# Patient Record
Sex: Female | Born: 1990 | Race: White | Hispanic: No | Marital: Single | Smoking: Never smoker
Health system: Southern US, Community
[De-identification: ages and names within clinical notes are randomized; demographics above are authoritative.]

---
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
## 2013-07-08 ENCOUNTER — Encounter: Payer: Self-pay | Admitting: Certified Nurse Midwife

## 2014-05-26 ENCOUNTER — Encounter: Payer: Self-pay | Admitting: Certified Nurse Midwife

## 2014-07-28 ENCOUNTER — Encounter: Payer: Self-pay | Admitting: Certified Nurse Midwife

## 2014-12-24 ENCOUNTER — Ambulatory Visit: Payer: Self-pay | Admitting: Certified Nurse Midwife

## 2014-12-25 ENCOUNTER — Encounter: Payer: Self-pay | Admitting: Certified Nurse Midwife

## 2014-12-25 ENCOUNTER — Ambulatory Visit (INDEPENDENT_AMBULATORY_CARE_PROVIDER_SITE_OTHER): Payer: 59 | Admitting: Certified Nurse Midwife

## 2014-12-25 VITALS — BP 120/72 | HR 70 | Resp 20 | Ht 66.25 in | Wt 132.0 lb

## 2014-12-25 DIAGNOSIS — Z30011 Encounter for initial prescription of contraceptive pills: Secondary | ICD-10-CM

## 2014-12-25 DIAGNOSIS — Z01419 Encounter for gynecological examination (general) (routine) without abnormal findings: Secondary | ICD-10-CM

## 2014-12-25 DIAGNOSIS — Z0000 Encounter for general adult medical examination without abnormal findings: Secondary | ICD-10-CM | POA: Diagnosis not present

## 2014-12-25 DIAGNOSIS — Z124 Encounter for screening for malignant neoplasm of cervix: Secondary | ICD-10-CM

## 2014-12-25 LAB — POCT URINALYSIS DIPSTICK
Bilirubin, UA: NEGATIVE
Blood, UA: NEGATIVE
GLUCOSE UA: NEGATIVE
Ketones, UA: NEGATIVE
LEUKOCYTES UA: NEGATIVE
Nitrite, UA: NEGATIVE
PROTEIN UA: NEGATIVE
UROBILINOGEN UA: NEGATIVE
pH, UA: 5

## 2014-12-25 LAB — HEMOGLOBIN, FINGERSTICK: HEMOGLOBIN, FINGERSTICK: 15 g/dL (ref 12.0–16.0)

## 2014-12-25 MED ORDER — DESOGESTREL-ETHINYL ESTRADIOL 0.15-30 MG-MCG PO TABS: 1.0000 | ORAL_TABLET | Freq: Every day | ORAL | Status: DC

## 2014-12-25 NOTE — Patient Instructions (Addendum)
General topics  Next pap or exam is  due in 1 year Take a Women's multivitamin Take 1200 mg. of calcium daily - prefer dietary If any concerns in interim to call back  Breast Self-Awareness Practicing breast self-awareness may pick up problems early, prevent significant medical complications, and possibly save your life. By practicing breast self-awareness, you can become familiar with how your breasts look and feel and if your breasts are changing. This allows you to notice changes early. It can also offer you some reassurance that your breast health is good. One way to learn what is normal for your breasts and whether your breasts are changing is to do a breast self-exam. If you find a lump or something that was not present in the past, it is best to contact your caregiver right away. Other findings that should be evaluated by your caregiver include nipple discharge, especially if it is bloody; skin changes or reddening; areas where the skin seems to be pulled in (retracted); or new lumps and bumps. Breast pain is seldom associated with cancer (malignancy), but should also be evaluated by a caregiver. BREAST SELF-EXAM The best time to examine your breasts is 5 7 days after your menstrual period is over.  ExitCare Patient Information 2013 [employer].   Exercise to Stay Healthy Exercise helps you become and stay healthy. EXERCISE IDEAS AND TIPS Choose exercises that:  You enjoy.  Fit into your day. You do not need to exercise really hard to be healthy. You can do exercises at a slow or medium level and stay healthy. You can:  Stretch before and after working out.  Try yoga, Pilates, or tai chi.  Lift weights.  Walk fast, swim, jog, run, climb stairs, bicycle, dance, or rollerskate.  Take aerobic classes. Exercises that burn about 150 calories:  Running 1  miles in 15 minutes.  Playing volleyball for 45 to 60 minutes.  Washing and waxing a car for 45 to 60  minutes.  Playing touch football for 45 minutes.  Walking 1  miles in 35 minutes.  Pushing a stroller 1  miles in 30 minutes.  Playing basketball for 30 minutes.  Raking leaves for 30 minutes.  Bicycling 5 miles in 30 minutes.  Walking 2 miles in 30 minutes.  Dancing for 30 minutes.  Shoveling snow for 15 minutes.  Swimming laps for 20 minutes.  Walking up stairs for 15 minutes.  Bicycling 4 miles in 15 minutes.  Gardening for 30 to 45 minutes.  Jumping rope for 15 minutes.  Washing windows or floors for 45 to 60 minutes. Document Released: 04/30/2010 Document Revised: 06/20/2011 Document Reviewed: 04/30/2010 ExitCare Patient Information 2013 [employer].   Other topics ( that may be useful information):    Sexually Transmitted Disease Sexually transmitted disease (STD) refers to any infection that is passed from person to person during sexual activity. This may happen by way of saliva, semen, blood, vaginal mucus, or urine. Common STDs include:  Gonorrhea.  Chlamydia.  Syphilis.  HIV/AIDS.  Genital herpes.  Hepatitis B and C.  Trichomonas.  Human papillomavirus (HPV).  Pubic lice. CAUSES  An STD may be spread by bacteria, virus, or parasite. A person can get an STD by:  Sexual intercourse with an infected person.  Sharing sex toys with an infected person.  Sharing needles with an infected person.  Having intimate contact with the genitals, mouth, or rectal areas of an infected person. SYMPTOMS  Some people may not have any symptoms, but   they can still pass the infection to others. Different STDs have different symptoms. Symptoms include:  Painful or bloody urination.  Pain in the pelvis, abdomen, vagina, anus, throat, or eyes.  Skin rash, itching, irritation, growths, or sores (lesions). These usually occur in the genital or anal area.  Abnormal vaginal discharge.  Penile discharge in men.  Soft, flesh-colored skin growths in the  genital or anal area.  Fever.  Pain or bleeding during sexual intercourse.  Swollen glands in the groin area.  Yellow skin and eyes (jaundice). This is seen with hepatitis. DIAGNOSIS  To make a diagnosis, your caregiver may:  Take a medical history.  Perform a physical exam.  Take a specimen (culture) to be examined.  Examine a sample of discharge under a microscope.  Perform blood test TREATMENT   Chlamydia, gonorrhea, trichomonas, and syphilis can be cured with antibiotic medicine.  Genital herpes, hepatitis, and HIV can be treated, but not cured, with prescribed medicines. The medicines will lessen the symptoms.  Genital warts from HPV can be treated with medicine or by freezing, burning (electrocautery), or surgery. Warts may come back.  HPV is a virus and cannot be cured with medicine or surgery.However, abnormal areas may be followed very closely by your caregiver and may be removed from the cervix, vagina, or vulva through office procedures or surgery. If your diagnosis is confirmed, your recent sexual partners need treatment. This is true even if they are symptom-free or have a negative culture or evaluation. They should not have sex until their caregiver says it is okay. HOME CARE INSTRUCTIONS  All sexual partners should be informed, tested, and treated for all STDs.  Take your antibiotics as directed. Finish them even if you start to feel better.  Only take over-the-counter or prescription medicines for pain, discomfort, or fever as directed by your caregiver.  Rest.  Eat a balanced diet and drink enough fluids to keep your urine clear or pale yellow.  Do not have sex until treatment is completed and you have followed up with your caregiver. STDs should be checked after treatment.  Keep all follow-up appointments, Pap tests, and blood tests as directed by your caregiver.  Only use latex condoms and water-soluble lubricants during sexual activity. Do not use  petroleum jelly or oils.  Avoid alcohol and illegal drugs.  Get vaccinated for HPV and hepatitis. If you have not received these vaccines in the past, talk to your caregiver about whether one or both might be right for you.  Avoid risky sex practices that can break the skin. The only way to avoid getting an STD is to avoid all sexual activity.Latex condoms and dental dams (for oral sex) will help lessen the risk of getting an STD, but will not completely eliminate the risk. SEEK MEDICAL CARE IF:   You have a fever.  You have any new or worsening symptoms. Document Released: 06/18/2002 Document Revised: 06/20/2011 Document Reviewed: 06/25/2010 [facility] Patient Information 2013 [place].    Domestic Abuse You are being battered or abused if someone close to you hits, pushes, or physically hurts you in any way. You also are being abused if you are forced into activities. You are being sexually abused if you are forced to have sexual contact of any kind. You are being emotionally abused if you are made to feel worthless or if you are constantly threatened. It is important to remember that help is available. No one has the right to abuse you. PREVENTION OF FURTHER  ABUSE  Learn the warning signs of danger. This varies with situations but may include: the use of alcohol, threats, isolation from friends and family, or forced sexual contact. Leave if you feel that violence is going to occur.  If you are attacked or beaten, report it to the police so the abuse is documented. You do not have to press charges. The police can protect you while you or the attackers are leaving. Get the officer's name and badge number and a copy of the report.  Find someone you can trust and tell them what is happening to you: your caregiver, a nurse, clergy member, close friend or family member. Feeling ashamed is natural, but remember that you have done nothing wrong. No one deserves abuse. Document Released:  03/25/2000 Document Revised: 06/20/2011 Document Reviewed: 06/03/2010 ExitCare Patient Information 2013 [employer].    How Much is Too Much Alcohol? Drinking too much alcohol can cause injury, accidents, and health problems. These types of problems can include:   Car crashes.  Falls.  Family fighting (domestic violence).  Drowning.  Fights.  Injuries.  Burns.  Damage to certain organs.  Having a baby with birth defects. ONE DRINK CAN BE TOO MUCH WHEN YOU ARE:  Working.  Pregnant or breastfeeding.  Taking medicines. Ask your doctor.  Driving or planning to drive. If you or someone you know has a drinking problem, get help from a doctor.  Document Released: 01/22/2009 Document Revised: 06/20/2011 Document Reviewed: 01/22/2009 ExitCare Patient Information 2013 [employer].   Smoking Hazards Smoking cigarettes is extremely bad for your health. Tobacco smoke has over 200 known poisons in it. There are over 60 chemicals in tobacco smoke that cause cancer. Some of the chemicals found in cigarette smoke include:   Cyanide.  Benzene.  Formaldehyde.  Methanol (wood alcohol).  Acetylene (fuel used in welding torches).  Ammonia. Cigarette smoke also contains the poisonous gases nitrogen oxide and carbon monoxide.  Cigarette smokers have an increased risk of many serious medical problems and Smoking causes approximately:  90% of all lung cancer deaths in men.  80% of all lung cancer deaths in women.  90% of deaths from chronic obstructive lung disease. Compared with nonsmokers, smoking increases the risk of:  Coronary heart disease by 2 to 4 times.  Stroke by 2 to 4 times.  Men developing lung cancer by 23 times.  Women developing lung cancer by 13 times.  Dying from chronic obstructive lung diseases by 12 times.  . Smoking is the most preventable cause of death and disease in our society.  WHY IS SMOKING ADDICTIVE?  Nicotine is the chemical  agent in tobacco that is capable of causing addiction or dependence.  When you smoke and inhale, nicotine is absorbed rapidly into the bloodstream through your lungs. Nicotine absorbed through the lungs is capable of creating a powerful addiction. Both inhaled and non-inhaled nicotine may be addictive.  Addiction studies of cigarettes and spit tobacco show that addiction to nicotine occurs mainly during the teen years, when young people begin using tobacco products. WHAT ARE THE BENEFITS OF QUITTING?  There are many health benefits to quitting smoking.   Likelihood of developing cancer and heart disease decreases. Health improvements are seen almost immediately.  Blood pressure, pulse rate, and breathing patterns start returning to normal soon after quitting. QUITTING SMOKING   American Lung Association - 1-800-LUNGUSA  American Cancer Society - 1-800-ACS-2345 Document Released: 05/05/2004 Document Revised: 06/20/2011 Document Reviewed: 01/07/2009 ExitCare Patient Information 2013 ExitCare[employer]   LL[employer].   Stress Management Stress is a state of physical or mental tension that often results from changes in your life or normal routine. Some common causes of stress are:  Death of a loved one.  Injuries or severe illnesses.  Getting fired or changing jobs.  Moving into a new home. Other causes may be:  Sexual problems.  Business or financial losses.  Taking on a large debt.  Regular conflict with someone at home or at work.  Constant tiredness from lack of sleep. It is not just bad things that are stressful. It may be stressful to:  Win the lottery.  Get married.  Buy a new car. The amount of stress that can be easily tolerated varies from person to person. Changes generally cause stress, regardless of the types of change. Too much stress can affect your health. It may lead to physical or emotional problems. Too little stress (boredom) may also become stressful. SUGGESTIONS TO  REDUCE STRESS:  Talk things over with your family and friends. It often is helpful to share your concerns and worries. If you feel your problem is serious, you may want to get help from a professional counselor.  Consider your problems one at a time instead of lumping them all together. Trying to take care of everything at once may seem impossible. List all the things you need to do and then start with the most important one. Set a goal to accomplish 2 or 3 things each day. If you expect to do too many in a single day you will naturally fail, causing you to feel even more stressed.  Do not use alcohol or drugs to relieve stress. Although you may feel better for a short time, they do not remove the problems that caused the stress. They can also be habit forming.  Exercise regularly - at least 3 times per week. Physical exercise can help to relieve that "uptight" feeling and will relax you.  The shortest distance between despair and hope is often a good night's sleep.  Go to bed and get up on time allowing yourself time for appointments without being rushed.  Take a short "time-out" period from any stressful situation that occurs during the day. Close your eyes and take some deep breaths. Starting with the muscles in your face, tense them, hold it for a few seconds, then relax. Repeat this with the muscles in your neck, shoulders, hand, stomach, back and legs.  Take good care of yourself. Eat a balanced diet and get plenty of rest.  Schedule time for having fun. Take a break from your daily routine to relax. HOME CARE INSTRUCTIONS   Call if you feel overwhelmed by your problems and feel you can no longer manage them on your own.  Return immediately if you feel like hurting yourself or someone else. Document Released: 09/21/2000 Document Revised: 06/20/2011 Document Reviewed: 05/14/2007 [facility] Patient Information 2013 [place].  Oral Contraception Use Oral contraceptive pills (OCPs)  are medicines taken to prevent pregnancy. OCPs work by preventing the ovaries from releasing eggs. The hormones in OCPs also cause the cervical mucus to thicken, preventing the sperm from entering the uterus. The hormones also cause the uterine lining to become thin, not allowing a fertilized egg to attach to the inside of the uterus. OCPs are highly effective when taken exactly as prescribed. However, OCPs do not prevent sexually transmitted diseases (STDs). Safe sex practices, such as using condoms along with an OCP, can help prevent STDs. Before  taking OCPs, you may have a physical exam and Pap test. Your health care provider may also order blood tests if necessary. Your health care provider will make sure you are a good candidate for oral contraception. Discuss with your health care provider the possible side effects of the OCP you may be prescribed. When starting an OCP, it can take 2 to 3 months for the body to adjust to the changes in hormone levels in your body.  HOW TO TAKE ORAL CONTRACEPTIVE PILLS Your health care provider may advise you on how to start taking the first cycle of OCPs. Otherwise, you can:   Start on day 1 of your menstrual period. You will not need any backup contraceptive protection with this start time.   Start on the first Sunday after your menstrual period or the day you get your prescription. In these cases, you will need to use backup contraceptive protection for the first week.   Start the pill at any time of your cycle. If you take the pill within 5 days of the start of your period, you are protected against pregnancy right away. In this case, you will not need a backup form of birth control. If you start at any other time of your menstrual cycle, you will need to use another form of birth control for 7 days. If your OCP is the type called a minipill, it will protect you from pregnancy after taking it for 2 days (48 hours). After you have started taking OCPs:   If you  forget to take 1 pill, take it as soon as you remember. Take the next pill at the regular time.   If you miss 2 or more pills, call your health care provider because different pills have different instructions for missed doses. Use backup birth control until your next menstrual period starts.   If you use a 28-day pack that contains inactive pills and you miss 1 of the last 7 pills (pills with no hormones), it will not matter. Throw away the rest of the non-hormone pills and start a new pill pack.  No matter which day you start the OCP, you will always start a new pack on that same day of the week. Have an extra pack of OCPs and a backup contraceptive method available in case you miss some pills or lose your OCP pack.  HOME CARE INSTRUCTIONS   Do not smoke.   Always use a condom to protect against STDs. OCPs do not protect against STDs.   Use a calendar to mark your menstrual period days.   Read the information and directions that came with your OCP. Talk to your health care provider if you have questions.  SEEK MEDICAL CARE IF:   You develop nausea and vomiting.   You have abnormal vaginal discharge or bleeding.   You develop a rash.   You miss your menstrual period.   You are losing your hair.   You need treatment for mood swings or depression.   You get dizzy when taking the OCP.   You develop acne from taking the OCP.   You become pregnant.  SEEK IMMEDIATE MEDICAL CARE IF:   You develop chest pain.   You develop shortness of breath.   You have an uncontrolled or severe headache.   You develop numbness or slurred speech.   You develop visual problems.   You develop pain, redness, and swelling in the legs.  Document Released: 03/17/2011 Document Revised: 08/12/2013 Document Reviewed:  09/16/2012 ExitCare Patient Information 2015 [place], [place]. This information is not intended to replace advice given to you by your health care provider. Make  sure you discuss any questions you have with your health care provider.

## 2014-12-25 NOTE — Progress Notes (Signed)
24 y.o. G0P0000 Single  Caucasian Fe here to establish gyn care and  for annual exam. Periods monthly, but not same day, light amount with duration of 3 days. Contraception working well, but would like to back on OCP. Was on Reclipsen in past with no problems. STD Screening labs desired. No other health issues today. Mother with patient(patient here). Patient verbalized permission for mother to be present for all conversation and exam if mother desires.  Patient's last menstrual period was 12/01/2014.          Sexually active: Yes.    The current method of family planning is condoms all the time.    Exercising: Yes.    alternate cardio with muscle training Smoker:  no  Health Maintenance: Pap:  none MMG:  none Colonoscopy:  none BMD:   none TDaP:  2009 Labs: urine-neg, hgb-15.0 Self breast exam: done monthly   reports that she has never smoked. She does not have any smokeless tobacco history on file. She reports that she drinks about 3.6 oz of alcohol per week.  History reviewed. No pertinent past medical history.  History reviewed. No pertinent past surgical history.  No current outpatient prescriptions on file.   No current facility-administered medications for this visit.    Family History  Problem Relation Age of Onset  . Colon cancer Maternal Grandfather   . Heart disease Maternal Grandfather     ROS:  Pertinent items are noted in HPI.  Otherwise, a comprehensive ROS was negative.  Exam:   BP 120/72 mmHg  Pulse 70  Resp 20  Ht 5' 6.25" (1.683 m)  Wt 132 lb (59.875 kg)  BMI 21.14 kg/m2  LMP 12/01/2014 Height: 5' 6.25" (168.3 cm) Ht Readings from Last 3 Encounters:  12/25/14 5' 6.25" (1.683 m)    General appearance: alert, cooperative and appears stated age Head: Normocephalic, without obvious abnormality, atraumatic Neck: no adenopathy, supple, symmetrical, trachea midline and thyroid normal to inspection and palpation Lungs: clear to auscultation  bilaterally Breasts: normal appearance, no masses or tenderness, No nipple retraction or dimpling, No nipple discharge or bleeding, No axillary or supraclavicular adenopathy Heart: regular rate and rhythm Abdomen: soft, non-tender; no masses,  no organomegaly Extremities: extremities normal, atraumatic, no cyanosis or edema Skin: Skin color, texture, turgor normal. No rashes or lesions Lymph nodes: Cervical, supraclavicular, and axillary nodes normal. No abnormal inguinal nodes palpated Neurologic: Grossly normal   Pelvic: External genitalia:  no lesions              Urethra:  normal appearing urethra with no masses, tenderness or lesions              Bartholin's and Skene's: normal                 Vagina: normal appearing vagina with normal color and discharge, no lesions              Cervix: normal,non tender,no lesions              Pap taken: Yes.   Bimanual Exam:  Uterus:  normal size, contour, position, consistency, mobility, non-tender and anteflexed              Adnexa: normal adnexa and no mass, fullness, tenderness               Rectovaginal: Confirms               Anus:  normal appearance  Chaperone present: yes  A:  Well Woman with normal exam  Contraception OCP desired  STD screening desired  Gardasil Candidate to finish series  P:   Reviewed health and wellness pertinent to exam  Rx Reclipsen see order  Labs: GC,Chlamydia, HIV,RPR,HSV1,2, Hep C, Affirm  Discussed risks and benefits and declines to finish  Pap smear as above with HPV reflex.   counseled on breast self exam, STD prevention, HIV risk factors and prevention, use and side effects of OCP's, adequate intake of calcium and vitamin D, diet and exercise  return annually or prn  An After Visit Summary was printed and given to the patient.

## 2014-12-26 LAB — WET PREP BY MOLECULAR PROBE
Candida species: NEGATIVE
GARDNERELLA VAGINALIS: NEGATIVE
Trichomonas vaginosis: NEGATIVE

## 2014-12-26 LAB — HIV ANTIBODY (ROUTINE TESTING W REFLEX): HIV 1&2 Ab, 4th Generation: NONREACTIVE

## 2014-12-26 LAB — HSV(HERPES SIMPLEX VRS) I + II AB-IGG: HSV 1 Glycoprotein G Ab, IgG: <0.1 IV

## 2014-12-26 LAB — RPR

## 2014-12-26 LAB — IPS PAP TEST WITH REFLEX TO HPV

## 2014-12-26 NOTE — Progress Notes (Signed)
Reviewed personally.  M. Suzanne Kemiyah Tarazon, MD.  

## 2014-12-27 LAB — IPS N GONORRHOEA AND CHLAMYDIA BY PCR

## 2015-12-30 ENCOUNTER — Ambulatory Visit: Payer: 59 | Admitting: Certified Nurse Midwife

## 2017-12-22 ENCOUNTER — Ambulatory Visit: Payer: 59 | Admitting: Certified Nurse Midwife

## 2018-01-12 ENCOUNTER — Telehealth: Payer: Self-pay | Admitting: Certified Nurse Midwife

## 2018-01-12 ENCOUNTER — Ambulatory Visit: Payer: 59 | Admitting: Certified Nurse Midwife

## 2018-01-12 ENCOUNTER — Other Ambulatory Visit (HOSPITAL_COMMUNITY)
Admission: RE | Admit: 2018-01-12 | Discharge: 2018-01-12 | Disposition: A | Discharge status: Home / Self Care | Payer: 59 | Source: Ambulatory Visit | Attending: Obstetrics & Gynecology | Admitting: Obstetrics & Gynecology

## 2018-01-12 ENCOUNTER — Other Ambulatory Visit: Payer: Self-pay

## 2018-01-12 ENCOUNTER — Encounter: Payer: Self-pay | Admitting: Certified Nurse Midwife

## 2018-01-12 VITALS — BP 116/70 | HR 70 | Resp 16 | Ht 66.75 in | Wt 126.0 lb

## 2018-01-12 DIAGNOSIS — Z0000 Encounter for general adult medical examination without abnormal findings: Secondary | ICD-10-CM

## 2018-01-12 DIAGNOSIS — Z113 Encounter for screening for infections with a predominantly sexual mode of transmission: Secondary | ICD-10-CM | POA: Diagnosis not present

## 2018-01-12 DIAGNOSIS — N912 Amenorrhea, unspecified: Secondary | ICD-10-CM

## 2018-01-12 DIAGNOSIS — Z01419 Encounter for gynecological examination (general) (routine) without abnormal findings: Secondary | ICD-10-CM

## 2018-01-12 DIAGNOSIS — Z124 Encounter for screening for malignant neoplasm of cervix: Secondary | ICD-10-CM | POA: Diagnosis present

## 2018-01-12 LAB — POCT URINE PREGNANCY: PREG TEST UR: NEGATIVE

## 2018-01-12 NOTE — Patient Instructions (Signed)
General topics  Next pap or exam is  due in 1 year Take a Women's multivitamin Take 1200 mg. of calcium daily - prefer dietary If any concerns in interim to call back  Breast Self-Awareness Practicing breast self-awareness may pick up problems early, prevent significant medical complications, and possibly save your life. By practicing breast self-awareness, you can become familiar with how your breasts look and feel and if your breasts are changing. This allows you to notice changes early. It can also offer you some reassurance that your breast health is good. One way to learn what is normal for your breasts and whether your breasts are changing is to do a breast self-exam. If you find a lump or something that was not present in the past, it is best to contact your caregiver right away. Other findings that should be evaluated by your caregiver include nipple discharge, especially if it is bloody; skin changes or reddening; areas where the skin seems to be pulled in (retracted); or new lumps and bumps. Breast pain is seldom associated with cancer (malignancy), but should also be evaluated by a caregiver. BREAST SELF-EXAM The best time to examine your breasts is 5 7 days after your menstrual period is over.  ExitCare Patient Information 2013 ExitCare, LLC.   Exercise to Stay Healthy Exercise helps you become and stay healthy. EXERCISE IDEAS AND TIPS Choose exercises that:  You enjoy.  Fit into your day. You do not need to exercise really hard to be healthy. You can do exercises at a slow or medium level and stay healthy. You can:  Stretch before and after working out.  Try yoga, Pilates, or tai chi.  Lift weights.  Walk fast, swim, jog, run, climb stairs, bicycle, dance, or rollerskate.  Take aerobic classes. Exercises that burn about 150 calories:  Running 1  miles in 15 minutes.  Playing volleyball for 45 to 60 minutes.  Washing and waxing a car for 45 to 60  minutes.  Playing touch football for 45 minutes.  Walking 1  miles in 35 minutes.  Pushing a stroller 1  miles in 30 minutes.  Playing basketball for 30 minutes.  Raking leaves for 30 minutes.  Bicycling 5 miles in 30 minutes.  Walking 2 miles in 30 minutes.  Dancing for 30 minutes.  Shoveling snow for 15 minutes.  Swimming laps for 20 minutes.  Walking up stairs for 15 minutes.  Bicycling 4 miles in 15 minutes.  Gardening for 30 to 45 minutes.  Jumping rope for 15 minutes.  Washing windows or floors for 45 to 60 minutes. Document Released: 04/30/2010 Document Revised: 06/20/2011 Document Reviewed: 04/30/2010 ExitCare Patient Information 2013 ExitCare, LLC.   Other topics ( that may be useful information):    Sexually Transmitted Disease Sexually transmitted disease (STD) refers to any infection that is passed from person to person during sexual activity. This may happen by way of saliva, semen, blood, vaginal mucus, or urine. Common STDs include:  Gonorrhea.  Chlamydia.  Syphilis.  HIV/AIDS.  Genital herpes.  Hepatitis B and C.  Trichomonas.  Human papillomavirus (HPV).  Pubic lice. CAUSES  An STD may be spread by bacteria, virus, or parasite. A person can get an STD by:  Sexual intercourse with an infected person.  Sharing sex toys with an infected person.  Sharing needles with an infected person.  Having intimate contact with the genitals, mouth, or rectal areas of an infected person. SYMPTOMS  Some people may not have any symptoms, but   they can still pass the infection to others. Different STDs have different symptoms. Symptoms include:  Painful or bloody urination.  Pain in the pelvis, abdomen, vagina, anus, throat, or eyes.  Skin rash, itching, irritation, growths, or sores (lesions). These usually occur in the genital or anal area.  Abnormal vaginal discharge.  Penile discharge in men.  Soft, flesh-colored skin growths in the  genital or anal area.  Fever.  Pain or bleeding during sexual intercourse.  Swollen glands in the groin area.  Yellow skin and eyes (jaundice). This is seen with hepatitis. DIAGNOSIS  To make a diagnosis, your caregiver may:  Take a medical history.  Perform a physical exam.  Take a specimen (culture) to be examined.  Examine a sample of discharge under a microscope.  Perform blood test TREATMENT   Chlamydia, gonorrhea, trichomonas, and syphilis can be cured with antibiotic medicine.  Genital herpes, hepatitis, and HIV can be treated, but not cured, with prescribed medicines. The medicines will lessen the symptoms.  Genital warts from HPV can be treated with medicine or by freezing, burning (electrocautery), or surgery. Warts may come back.  HPV is a virus and cannot be cured with medicine or surgery.However, abnormal areas may be followed very closely by your caregiver and may be removed from the cervix, vagina, or vulva through office procedures or surgery. If your diagnosis is confirmed, your recent sexual partners need treatment. This is true even if they are symptom-free or have a negative culture or evaluation. They should not have sex until their caregiver says it is okay. HOME CARE INSTRUCTIONS  All sexual partners should be informed, tested, and treated for all STDs.  Take your antibiotics as directed. Finish them even if you start to feel better.  Only take over-the-counter or prescription medicines for pain, discomfort, or fever as directed by your caregiver.  Rest.  Eat a balanced diet and drink enough fluids to keep your urine clear or pale yellow.  Do not have sex until treatment is completed and you have followed up with your caregiver. STDs should be checked after treatment.  Keep all follow-up appointments, Pap tests, and blood tests as directed by your caregiver.  Only use latex condoms and water-soluble lubricants during sexual activity. Do not use  petroleum jelly or oils.  Avoid alcohol and illegal drugs.  Get vaccinated for HPV and hepatitis. If you have not received these vaccines in the past, talk to your caregiver about whether one or both might be right for you.  Avoid risky sex practices that can break the skin. The only way to avoid getting an STD is to avoid all sexual activity.Latex condoms and dental dams (for oral sex) will help lessen the risk of getting an STD, but will not completely eliminate the risk. SEEK MEDICAL CARE IF:   You have a fever.  You have any new or worsening symptoms. Document Released: 06/18/2002 Document Revised: 06/20/2011 Document Reviewed: 06/25/2010 [facility] Patient Information 2013 [employer].    Domestic Abuse You are being battered or abused if someone close to you hits, pushes, or physically hurts you in any way. You also are being abused if you are forced into activities. You are being sexually abused if you are forced to have sexual contact of any kind. You are being emotionally abused if you are made to feel worthless or if you are constantly threatened. It is important to remember that help is available. No one has the right to abuse you. PREVENTION OF FURTHER  ABUSE  Learn the warning signs of danger. This varies with situations but may include: the use of alcohol, threats, isolation from friends and family, or forced sexual contact. Leave if you feel that violence is going to occur.  If you are attacked or beaten, report it to the police so the abuse is documented. You do not have to press charges. The police can protect you while you or the attackers are leaving. Get the officer's name and badge number and a copy of the report.  Find someone you can trust and tell them what is happening to you: your caregiver, a nurse, clergy member, close friend or family member. Feeling ashamed is natural, but remember that you have done nothing wrong. No one deserves abuse. Document Released:  03/25/2000 Document Revised: 06/20/2011 Document Reviewed: 06/03/2010 ExitCare Patient Information 2013 ExitCare, LLC.    How Much is Too Much Alcohol? Drinking too much alcohol can cause injury, accidents, and health problems. These types of problems can include:   Car crashes.  Falls.  Family fighting (domestic violence).  Drowning.  Fights.  Injuries.  Burns.  Damage to certain organs.  Having a baby with birth defects. ONE DRINK CAN BE TOO MUCH WHEN YOU ARE:  Working.  Pregnant or breastfeeding.  Taking medicines. Ask your doctor.  Driving or planning to drive. If you or someone you know has a drinking problem, get help from a doctor.  Document Released: 01/22/2009 Document Revised: 06/20/2011 Document Reviewed: 01/22/2009 ExitCare Patient Information 2013 ExitCare, LLC.   Smoking Hazards Smoking cigarettes is extremely bad for your health. Tobacco smoke has over 200 known poisons in it. There are over 60 chemicals in tobacco smoke that cause cancer. Some of the chemicals found in cigarette smoke include:   Cyanide.  Benzene.  Formaldehyde.  Methanol (wood alcohol).  Acetylene (fuel used in welding torches).  Ammonia. Cigarette smoke also contains the poisonous gases nitrogen oxide and carbon monoxide.  Cigarette smokers have an increased risk of many serious medical problems and Smoking causes approximately:  90% of all lung cancer deaths in men.  80% of all lung cancer deaths in women.  90% of deaths from chronic obstructive lung disease. Compared with nonsmokers, smoking increases the risk of:  Coronary heart disease by 2 to 4 times.  Stroke by 2 to 4 times.  Men developing lung cancer by 23 times.  Women developing lung cancer by 13 times.  Dying from chronic obstructive lung diseases by 12 times.  . Smoking is the most preventable cause of death and disease in our society.  WHY IS SMOKING ADDICTIVE?  Nicotine is the chemical  agent in tobacco that is capable of causing addiction or dependence.  When you smoke and inhale, nicotine is absorbed rapidly into the bloodstream through your lungs. Nicotine absorbed through the lungs is capable of creating a powerful addiction. Both inhaled and non-inhaled nicotine may be addictive.  Addiction studies of cigarettes and spit tobacco show that addiction to nicotine occurs mainly during the teen years, when young people begin using tobacco products. WHAT ARE THE BENEFITS OF QUITTING?  There are many health benefits to quitting smoking.   Likelihood of developing cancer and heart disease decreases. Health improvements are seen almost immediately.  Blood pressure, pulse rate, and breathing patterns start returning to normal soon after quitting. QUITTING SMOKING   American Lung Association - 1-800-LUNGUSA  American Cancer Society - 1-800-ACS-2345 Document Released: 05/05/2004 Document Revised: 06/20/2011 Document Reviewed: 01/07/2009 ExitCare Patient Information 2013 ExitCare,   LLC.   Stress Management Stress is a state of physical or mental tension that often results from changes in your life or normal routine. Some common causes of stress are:  Death of a loved one.  Injuries or severe illnesses.  Getting fired or changing jobs.  Moving into a new home. Other causes may be:  Sexual problems.  Business or financial losses.  Taking on a large debt.  Regular conflict with someone at home or at work.  Constant tiredness from lack of sleep. It is not just bad things that are stressful. It may be stressful to:  Win the lottery.  Get married.  Buy a new car. The amount of stress that can be easily tolerated varies from person to person. Changes generally cause stress, regardless of the types of change. Too much stress can affect your health. It may lead to physical or emotional problems. Too little stress (boredom) may also become stressful. SUGGESTIONS TO  REDUCE STRESS:  Talk things over with your family and friends. It often is helpful to share your concerns and worries. If you feel your problem is serious, you may want to get help from a professional counselor.  Consider your problems one at a time instead of lumping them all together. Trying to take care of everything at once may seem impossible. List all the things you need to do and then start with the most important one. Set a goal to accomplish 2 or 3 things each day. If you expect to do too many in a single day you will naturally fail, causing you to feel even more stressed.  Do not use alcohol or drugs to relieve stress. Although you may feel better for a short time, they do not remove the problems that caused the stress. They can also be habit forming.  Exercise regularly - at least 3 times per week. Physical exercise can help to relieve that "uptight" feeling and will relax you.  The shortest distance between despair and hope is often a good night's sleep.  Go to bed and get up on time allowing yourself time for appointments without being rushed.  Take a short "time-out" period from any stressful situation that occurs during the day. Close your eyes and take some deep breaths. Starting with the muscles in your face, tense them, hold it for a few seconds, then relax. Repeat this with the muscles in your neck, shoulders, hand, stomach, back and legs.  Take good care of yourself. Eat a balanced diet and get plenty of rest.  Schedule time for having fun. Take a break from your daily routine to relax. HOME CARE INSTRUCTIONS   Call if you feel overwhelmed by your problems and feel you can no longer manage them on your own.  Return immediately if you feel like hurting yourself or someone else. Document Released: 09/21/2000 Document Revised: 06/20/2011 Document Reviewed: 05/14/2007 ExitCare Patient Information 2013 ExitCare, LLC.   

## 2018-01-12 NOTE — Telephone Encounter (Signed)
Patient sent the following message through MyChart. Routing to triage to assist patient with request.   Courtney Boyle!  It was so nice to see and visit with you today. You are simply, the best. I did have a question regarding where you said my uterus is located I forgot to ask (down towards my bottom). This won't be an issue for having children/pregnancy or anything will it? I assume you would have said something if this was the case but I just wanted to make sure....as I hope to eventually raise mini me's one day...even though that could be terrifying :) Thanks and look forward to being in touch soon! :)    -Courtney Boyle (happy, chipper girl)

## 2018-01-12 NOTE — Telephone Encounter (Signed)
Call to patient. Advised Courtney Boyle's office notes her uterus is anteflexed and no issues with pregnancy.   Routing to [employer]. Encounter closed.

## 2018-01-12 NOTE — Progress Notes (Signed)
27 y.o. G0P0000 Single  Caucasian Fe here to re-establish gyn care and for annual exam. Living back at home to be able to finish college at [employer].Periods have been normal and regular. LMP was end of August, but has been stressed with moving home and starting college for the first time. Partner change and desires STD screening. Contraception condoms consistent. No other health issues.   Patient's last menstrual period was 11/23/2017.          Sexually active: Yes.    The current method of family planning is condoms all the time.    Exercising: Yes.    conditioning, running Smoker:  no  Review of Systems  Constitutional: Negative.   HENT: Negative.   Eyes: Negative.   Respiratory: Negative.   Cardiovascular: Negative.   Gastrointestinal: Negative.   Genitourinary:       Amenorrhea  Musculoskeletal: Negative.   Skin: Negative.   Neurological: Negative.   Endo/Heme/Allergies: Negative.   Psychiatric/Behavioral: Negative.     Health Maintenance: Pap:  12-25-14 neg History of Abnormal Pap: no MMG:  none Self Breast exams: yes Colonoscopy:  none BMD:   none TDaP:  2009, declined Shingles: no Pneumonia: no Hep C and HIV: HIV neg 2016 Labs: upt-neg   reports that she has never smoked. She has never used smokeless tobacco. She reports that she drinks about 6.0 standard drinks of alcohol per week. She reports that she does not use drugs.  History reviewed. No pertinent past medical history.  Past Surgical History:  Procedure Laterality Date  . WISDOM TOOTH EXTRACTION Bilateral 04/12/11    Current Outpatient Medications  Medication Sig Dispense Refill  . Multiple Vitamins-Minerals (AIRBORNE PO) Take by mouth.     No current facility-administered medications for this visit.     Family History  Problem Relation Age of Onset  . Colon cancer Maternal Grandfather   . Heart disease Maternal Grandfather     ROS:  Pertinent items are noted in HPI.  Otherwise, a  comprehensive ROS was negative.  Exam:   BP 116/70   Pulse 70   Resp 16   Ht 5' 6.75" (1.695 m)   Wt 126 lb (57.2 kg)   LMP 11/23/2017   BMI 19.88 kg/m  Height: 5' 6.75" (169.5 cm) Ht Readings from Last 3 Encounters:  01/12/18 5' 6.75" (1.695 m)  12/25/14 5' 6.25" (1.683 m)    General appearance: alert, cooperative and appears stated age Head: Normocephalic, without obvious abnormality, atraumatic Neck: no adenopathy, supple, symmetrical, trachea midline and thyroid normal to inspection and palpation Lungs: clear to auscultation bilaterally Breasts: normal appearance, no masses or tenderness, No nipple retraction or dimpling, No nipple discharge or bleeding, No axillary or supraclavicular adenopathy, Taught monthly breast self examination Heart: regular rate and rhythm Abdomen: soft, non-tender; no masses,  no organomegaly Extremities: extremities normal, atraumatic, no cyanosis or edema Skin: Skin color, texture, turgor normal. No rashes or lesions Lymph nodes: Cervical, supraclavicular, and axillary nodes normal. No abnormal inguinal nodes palpated Neurologic: Grossly normal   Pelvic: External genitalia:  no lesions              Urethra:  normal appearing urethra with no masses, tenderness or lesions              Bartholin's and Skene's: normal                 Vagina: normal appearing vagina with normal color and discharge, no lesions  Cervix: no cervical motion tenderness, no lesions and nulliparous appearance              Pap taken: Yes.   Bimanual Exam:  Uterus:  normal size, contour, position, consistency, mobility, non-tender and anteflexed              Adnexa: normal adnexa and no mass, fullness, tenderness               Rectovaginal: Confirms               Anus:  Normal appearance, no lesions  Chaperone present: yes  A:  Well Woman with normal exam  Contraception consistent condoms  Amenorrhea with negative UPT  STD screening  TDAP due  declines  P:   Reviewed health and wellness pertinent to exam  Discussed importance of consistent use and having spermicide available if condom breakage.  Patient to call if no period by end of October.  Labs: STD panel, Hep C, Gc/chlamydia, Affirm  Pap smear: yes   counseled on breast self exam, STD prevention, HIV risk factors and prevention, feminine hygiene, adequate intake of calcium and vitamin D, diet and exercise and reviewed her female anatomy per her request with CMA in attendance.  return annually or prn  An After Visit Summary was printed and given to the patient.

## 2018-01-13 LAB — HEPATITIS C ANTIBODY: Hep C Virus Ab: <0.1 s/co ratio (ref 0.0–0.9)

## 2018-01-13 LAB — HEP, RPR, HIV PANEL
HIV SCREEN 4TH GENERATION: NONREACTIVE
Hepatitis B Surface Ag: NEGATIVE
RPR: NONREACTIVE

## 2018-01-14 LAB — VAGINITIS/VAGINOSIS, DNA PROBE
Candida Species: NEGATIVE
Gardnerella vaginalis: NEGATIVE
TRICHOMONAS VAG: NEGATIVE

## 2018-01-15 LAB — CYTOLOGY - PAP: DIAGNOSIS: NEGATIVE

## 2018-01-15 LAB — GC/CHLAMYDIA PROBE AMP
CHLAMYDIA, DNA PROBE: NEGATIVE
NEISSERIA GONORRHOEAE BY PCR: NEGATIVE

## 2018-07-19 ENCOUNTER — Telehealth: Payer: Self-pay | Admitting: Certified Nurse Midwife

## 2018-07-19 NOTE — Telephone Encounter (Signed)
Message   Hey, I am choosing to take advantage of this awsome resource. Google tells you that you have only months to live when googling anything medica-related. I have a, what I believe, is an enlarged lymph node near the right lower portion of my neck. It is not painful and you can only see it if I stretch my neck far to the left. My brother, who is 47, also similarly has one on his neck, and a few others will sometimes appear from time-to-time with him. They are roughly the size of a kidney bean or smaller. I remember reading somewhere that it was normal for people our age to have these. They seem to stick around for quite some time...I believe mine, I feel, has always been there. The last time I saw Eunice Blase (back in October), she did not mention anything of concern when she gave me an exam. Anyhow, sorry to burden with these questions during a very troubling time, but I appreicate you all having these services available.  Thank you! ps. I attached a picture with my finger pointing to it.

## 2018-07-19 NOTE — Telephone Encounter (Signed)
Routing to Dr.Jertson for review. 

## 2018-07-20 NOTE — Telephone Encounter (Signed)
The following mychart message was sent to the patient:  Hi Courtney Boyle, You have lymph nodes throughout your body, there are several in your neck, axilla and groin that can be easily palpated. Lymph nodes can become enlarged if your body is fighting an infection. If a particular lymph node has always been the same size it is not likely to be anything significant. If all of a sudden you have one lymph node that feels bigger than the rest and it doesn't go away, then it should be evaluated.  Your primary care provider would be the best person to evaluate this for you. I hope this was helpful. If you continue to have concerns you should be seen and examined. Have a great weekend! Gertie Exon, MD (covering for Ms Darcel Bayley)

## 2018-07-23 ENCOUNTER — Telehealth: Payer: Self-pay | Admitting: Certified Nurse Midwife

## 2018-07-23 NOTE — Telephone Encounter (Signed)
I agree with doing a pregnancy test if she is sexually active.  I recommend she continue to do her exercise if she enjoys it.  (It is also a good stress reliever!) If she continues to skip her periods, I recommend an office visit and blood work to check some hormonal functioning.

## 2018-07-23 NOTE — Telephone Encounter (Signed)
Patient returning call.

## 2018-07-23 NOTE — Telephone Encounter (Signed)
Spoke with patient. Advised of message as seen below from Dr.Silva. Patient verbalizes understanding. Encounter closed. 

## 2018-07-23 NOTE — Telephone Encounter (Signed)
Hey, I have a question regarding my menstruel cycle. 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 However, in the last 5 to 6 months, they have actually been pretty nice and almost (within a week or so) regular. This could have had to do with the fact that I decided to focus more on school rather than exercise around when this regularity began to happen. So, given this quarantine situation, I have been running almost every day and conditioning beforehand. I thought I felt the normal "ovulation stitch" I normally get about 3 weeks ago thinking my period would start. But so far, absolutely nothing, not even the brown spotting beforehand I normally get. So I am wondering should I stop working out to give my body a rest so I can get my period? I am about 9 days late as of now

## 2018-07-23 NOTE — Telephone Encounter (Signed)
Patient is returning call to Kaitlyn.  

## 2018-07-23 NOTE — Telephone Encounter (Signed)
Spoke with patient. Patient's LMP was 06/12/2018 and lasted 5 days. Reports last 5-6 months cycles have been normal and on time. Patient is sexually active using condoms for contraceptions. Advised will need to take UPT if there is any chance for pregnancy. Patient verbalizes understanding. 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 Since stay at home order patient has been conditioning and running daily. Reports she feels she has lost weight. Patient is 5'6.75" and last weight was 126 lb. Patient does not have a scale. Had "ovulation pain" 3 weeks ago, so far has had no bleeding, spotting, or cramping, which she reports she normally has before her menses. Asking if she needs to decrease exercise to have her menses. Advised will review with MD and return call.

## 2018-07-23 NOTE — Telephone Encounter (Signed)
Left message to call Kaitlyn at [phone]. 

## 2018-10-08 ENCOUNTER — Telehealth: Payer: Self-pay | Admitting: Certified Nurse Midwife

## 2018-10-08 NOTE — Telephone Encounter (Signed)
Spoke with patient. Reports vaginal itching and redness, slight odor, started on 6/27. Has tried OTC monistat 3 day, no change in symptoms. Denies pain, vaginal d/c, urinary symptoms, fever/chills. Requesting Rx. Advised OV needed for further evaluation. Covid 19 prescreen negative, precautions reviewed. OV scheduled for 6/30 at 11:30am with Melvia Heaps, CNM.   Routing to provider for final review. Patient is agreeable to disposition. Will close encounter.

## 2018-10-08 NOTE — Telephone Encounter (Signed)
Patient sent the following correspondence through [facility]. Routing to triage to assist patient with request.  Christoper Fabian,   I also forgot to mention that, given I turned 28 on the 26th of this month, I will only be covered by health insurance through tomorrow. Thanks!

## 2018-10-08 NOTE — Telephone Encounter (Signed)
Patient sent the following correspondence through [facility]. Routing to triage to assist patient with request.  Christoper Fabian! I hope you are well during this crazy time. I, unfortunately, have contracted what I believe is a yeast infection that started two days ago. Symptoms are itchiness and redness inside the labia and into the vagina. I started a 3 day Monistat pack from [employer] with the insertable capsules on Saturday night, however, It's not improving like I had hoped (granted I am still going for runs during the day and did have intercourse last night). I haven't had one in years so I am hoping you might be able to prescribe something that will knock this out of the park. Pandemic, sandstorms, yeast infections, what's next? :) Thanks!

## 2018-10-09 ENCOUNTER — Encounter: Payer: Self-pay | Admitting: Certified Nurse Midwife

## 2018-10-09 ENCOUNTER — Other Ambulatory Visit: Payer: Self-pay

## 2018-10-09 ENCOUNTER — Ambulatory Visit: Payer: 59 | Admitting: Certified Nurse Midwife

## 2018-10-09 ENCOUNTER — Telehealth: Payer: Self-pay | Admitting: Certified Nurse Midwife

## 2018-10-09 VITALS — BP 104/64 | HR 60 | Temp 97.2°F | Resp 16 | Wt 128.0 lb

## 2018-10-09 DIAGNOSIS — N898 Other specified noninflammatory disorders of vagina: Secondary | ICD-10-CM | POA: Diagnosis not present

## 2018-10-09 DIAGNOSIS — N912 Amenorrhea, unspecified: Secondary | ICD-10-CM

## 2018-10-09 DIAGNOSIS — Z3202 Encounter for pregnancy test, result negative: Secondary | ICD-10-CM | POA: Diagnosis not present

## 2018-10-09 LAB — POCT URINE PREGNANCY: Preg Test, Ur: NEGATIVE

## 2018-10-09 NOTE — Telephone Encounter (Signed)
Patient's mom Diane is calling regarding her daughters appointment today. Diane wants to make sure that the labs done today were to test for the present of yeast only. Diane states that her daughter will not have insurance coverage as of 10/10/18 and is asking if these labs could be expedited due to the issue? DPR on file to talk with mom Diane.

## 2018-10-09 NOTE — Telephone Encounter (Signed)
Spoke with patients mom, Diane, ok per dpr. Questions answered, request to proceed with vaginitis testing as ordered. Advised mom patient will be contacted once results returned, reviewed Good Rx as saving option for future Rx, if needed.   Routing to provider for final review. Patient is agreeable to disposition. Will close encounter.

## 2018-10-09 NOTE — Progress Notes (Signed)
28 y.o. Single Caucasian female G0P0000 here with complaint of vaginal symptoms of itching in labia and vulva area, and slight discharge. Describes discharge as white, slight odor. Treated self with Monistat 3 ovules.. Onset of symptoms 3 days ago. Denies new personal products or vaginal dryness. No STD concerns same partner. Urinary symptoms none . Contraception is condoms consistent. Periods normal slightly late and negative UPT here today negative. No other health issues. Has aex scheduled in 01/2019.  Review of Systems  Constitutional: Negative.   HENT: Negative.   Eyes: Negative.   Respiratory: Negative.   Cardiovascular: Negative.   Gastrointestinal: Negative.   Genitourinary: Negative.   Musculoskeletal: Negative.   Skin:       Vaginal itching, irration, redness  Neurological: Negative.   Endo/Heme/Allergies: Negative.   Psychiatric/Behavioral: Negative.     O:Healthy female WDWN Affect: normal, orientation x 3  Exam: WDWN healthy female Abdomen: Soft, non tender  inguinal Lymph nodes: no enlargement or tenderness Pelvic exam: External genital: normal female BUS: negative Vagina: copious white slightly odorous discharge noted. Monistat ovule residue noted.  Affirm taken Cervix: normal, non tender, no CMT Uterus: normal, non tender Adnexa:normal, non tender, no masses or fullness noted  UPT-neg   A:Normal pelvic exam R/O vaginal infection Treating self with Monistat, symptoms continuing Contraception consistent condoms   P:Discussed findings of discharge and etiology. Discussed Aveeno or baking soda sitz bath for comfort. Avoid moist clothes  for extended period of time. If working out in gym clothes or swim suits for long periods of time change underwear or bottoms of swimsuit if possible.  Stressed condom use for contraception and STD protection. Lab: Affirm will treat if indicated  Rv prn

## 2018-10-10 ENCOUNTER — Telehealth: Payer: Self-pay

## 2018-10-10 LAB — VAGINITIS/VAGINOSIS, DNA PROBE
Candida Species: NEGATIVE
Gardnerella vaginalis: NEGATIVE
Trichomonas vaginosis: NEGATIVE

## 2018-10-10 NOTE — Telephone Encounter (Signed)
Left message for call back.

## 2018-10-10 NOTE — Telephone Encounter (Signed)
-----   Message from Regina Eck, CNM sent at 10/10/2018  9:16 AM EDT ----- Notify patient vaginal screening was negative for yeast,BV and trichomonas

## 2018-10-10 NOTE — Telephone Encounter (Signed)
Patient returning call.

## 2018-10-10 NOTE — Telephone Encounter (Signed)
Patient notified of results as written by provider 

## 2019-01-15 NOTE — Progress Notes (Deleted)
28 y.o. G0P0000 Single  {Race/ethnicity:17218} Fe here for annual exam.    No LMP recorded.          Sexually active: {yes no:314532}  The current method of family planning is {contraception:315051}.    Exercising: {yes no:314532}  {types:19826} Smoker:  {YES NO:22349}  ROS  Health Maintenance: Pap:  01-12-18 neg History of Abnormal Pap: {YES NO:22349} MMG:  none Self Breast exams: {YES NO:22349} Colonoscopy:  none BMD:   none TDaP:  2009 Shingles: no Pneumonia: no Hep C and HIV: both neg 2019 Labs: ***   reports that she has never smoked. She has never used smokeless tobacco. She reports current alcohol use of about 6.0 standard drinks of alcohol per week. She reports that she does not use drugs.  No past medical history on file.  Past Surgical History:  Procedure Laterality Date  . WISDOM TOOTH EXTRACTION Bilateral 04/12/11    Current Outpatient Medications  Medication Sig Dispense Refill  . Multiple Vitamin (MULTIVITAMIN PO) Take by mouth.    . Multiple Vitamins-Minerals (ZINC PO) Take by mouth.     No current facility-administered medications for this visit.     Family History  Problem Relation Age of Onset  . Colon cancer Maternal Grandfather   . Heart disease Maternal Grandfather   . Breast cancer Paternal Aunt     ROS:  Pertinent items are noted in HPI.  Otherwise, a comprehensive ROS was negative.  Exam:   There were no vitals taken for this visit.   Ht Readings from Last 3 Encounters:  01/12/18 5' 6.75" (1.695 m)  12/25/14 5' 6.25" (1.683 m)    General appearance: alert, cooperative and appears stated age Head: Normocephalic, without obvious abnormality, atraumatic Neck: no adenopathy, supple, symmetrical, trachea midline and thyroid {EXAM; THYROID:18604} Lungs: clear to auscultation bilaterally Breasts: {Exam; breast:13139::"normal appearance, no masses or tenderness"} Heart: regular rate and rhythm Abdomen: soft, non-tender; no masses,  no  organomegaly Extremities: extremities normal, atraumatic, no cyanosis or edema Skin: Skin color, texture, turgor normal. No rashes or lesions Lymph nodes: Cervical, supraclavicular, and axillary nodes normal. No abnormal inguinal nodes palpated Neurologic: Grossly normal   Pelvic: External genitalia:  no lesions              Urethra:  normal appearing urethra with no masses, tenderness or lesions              Bartholin's and Skene's: normal                 Vagina: normal appearing vagina with normal color and discharge, no lesions              Cervix: {exam; cervix:14595}              Pap taken: {yes no:314532} Bimanual Exam:  Uterus:  {exam; uterus:12215}              Adnexa: {exam; adnexa:12223}               Rectovaginal: Confirms               Anus:  normal sphincter tone, no lesions  Chaperone present: ***  A:  Well Woman with normal exam  P:   Reviewed health and wellness pertinent to exam  Pap smear: {YES NO:22349}  {plan; gyn:5269::"mammogram","pap smear","return annually or prn"}  An After Visit Summary was printed and given to the patient.

## 2019-01-16 ENCOUNTER — Ambulatory Visit: Payer: 59 | Admitting: Certified Nurse Midwife

## 2019-06-24 ENCOUNTER — Encounter: Payer: Self-pay | Admitting: Certified Nurse Midwife

## 2019-06-26 ENCOUNTER — Encounter: Payer: Self-pay | Admitting: Certified Nurse Midwife

## 2021-04-12 ENCOUNTER — Other Ambulatory Visit: Payer: Self-pay

## 2021-04-12 ENCOUNTER — Encounter (HOSPITAL_COMMUNITY): Payer: Self-pay | Admitting: Obstetrics & Gynecology

## 2021-04-12 ENCOUNTER — Inpatient Hospital Stay (HOSPITAL_COMMUNITY)
Admission: AD | Admit: 2021-04-12 | Discharge: 2021-04-12 | Disposition: A | Discharge status: Home / Self Care | Payer: BC Managed Care – PPO | Attending: Obstetrics & Gynecology | Admitting: Obstetrics & Gynecology

## 2021-04-12 ENCOUNTER — Inpatient Hospital Stay (HOSPITAL_COMMUNITY): Payer: BC Managed Care – PPO

## 2021-04-12 DIAGNOSIS — Z3A11 11 weeks gestation of pregnancy: Secondary | ICD-10-CM | POA: Insufficient documentation

## 2021-04-12 DIAGNOSIS — Z3A01 Less than 8 weeks gestation of pregnancy: Secondary | ICD-10-CM

## 2021-04-12 DIAGNOSIS — O209 Hemorrhage in early pregnancy, unspecified: Secondary | ICD-10-CM | POA: Insufficient documentation

## 2021-04-12 DIAGNOSIS — O3680X0 Pregnancy with inconclusive fetal viability, not applicable or unspecified: Secondary | ICD-10-CM | POA: Diagnosis not present

## 2021-04-12 DIAGNOSIS — O26851 Spotting complicating pregnancy, first trimester: Secondary | ICD-10-CM | POA: Diagnosis not present

## 2021-04-12 DIAGNOSIS — Q519 Congenital malformation of uterus and cervix, unspecified: Secondary | ICD-10-CM

## 2021-04-12 HISTORY — DX: Congenital malformation of uterus and cervix, unspecified: Q51.9

## 2021-04-12 LAB — CBC
HCT: 40.8 % (ref 36.0–46.0)
Hemoglobin: 14.2 g/dL (ref 12.0–15.0)
MCH: 30.6 pg (ref 26.0–34.0)
MCHC: 34.8 g/dL (ref 30.0–36.0)
MCV: 87.9 fL (ref 80.0–100.0)
Platelets: 248 10*3/uL (ref 150–400)
RBC: 4.64 MIL/uL (ref 3.87–5.11)
RDW: 11.9 % (ref 11.5–15.5)
WBC: 8.8 10*3/uL (ref 4.0–10.5)
nRBC: 0 % (ref 0.0–0.2)

## 2021-04-12 LAB — URINALYSIS, ROUTINE W REFLEX MICROSCOPIC
Bilirubin Urine: NEGATIVE
Glucose, UA: NEGATIVE mg/dL
Ketones, ur: NEGATIVE mg/dL
Leukocytes,Ua: NEGATIVE
Nitrite: NEGATIVE
Protein, ur: NEGATIVE mg/dL
Specific Gravity, Urine: 1.003 — ABNORMAL LOW (ref 1.005–1.030)
pH: 7 (ref 5.0–8.0)

## 2021-04-12 LAB — ABO/RH: ABO/RH(D): A POS

## 2021-04-12 LAB — WET PREP, GENITAL
Clue Cells Wet Prep HPF POC: NONE SEEN
Sperm: NONE SEEN
Trich, Wet Prep: NONE SEEN
WBC, Wet Prep HPF POC: <10 (ref <10)
Yeast Wet Prep HPF POC: NONE SEEN

## 2021-04-12 LAB — POCT PREGNANCY, URINE: Preg Test, Ur: POSITIVE — AB

## 2021-04-12 LAB — HCG, QUANTITATIVE, PREGNANCY: hCG, Beta Chain, Quant, S: 843 m[IU]/mL — ABNORMAL HIGH (ref <5)

## 2021-04-12 NOTE — MAU Provider Note (Signed)
Chief Complaint:  Vaginal Bleeding   Event Date/Time   First Provider Initiated Contact with Patient 04/12/21 1112     HPI: Courtney Boyle is a 31 y.o. G2P0010 at [redacted]w[redacted]d who presents to maternity admissions reporting vaginal spotting x1wk after positive UPT a week ago. Her LMP places her at 11wks but she has very irregular periods so is very unsure of dates and gestational age. Is not planning to continue the pregnancy but wants to be sure she does not have an ectopic pregnancy before undergoing a medical abortion. Having some mild cramping but nothing severe, bleeding has been slight only when wiping. No other physical complaints.   Pregnancy Course: Has no regular GYN, went to [facility] for abortion care but plans to establish routine GYN care at [facility].  History reviewed. No pertinent past medical history. OB History  Gravida Para Term Preterm AB Living  2 0 0 0 1 0  SAB IAB Ectopic Multiple Live Births  0 1 0 0      # Outcome Date GA Lbr Len/2nd Weight Sex Delivery Anes PTL Lv  2 Current           1 IAB      TAB      Past Surgical History:  Procedure Laterality Date   WISDOM TOOTH EXTRACTION Bilateral 04/12/11   Family History  Problem Relation Age of Onset   Colon cancer Maternal Grandfather    Heart disease Maternal Grandfather    Breast cancer Paternal Aunt    Social History   Tobacco Use   Smoking status: Never   Smokeless tobacco: Never  Substance Use Topics   Alcohol use: Yes    Alcohol/week: 6.0 standard drinks    Types: 6 Standard drinks or equivalent per week   Drug use: Never   No Known Allergies No medications prior to admission.   I have reviewed patient's Past Medical Hx, Surgical Hx, Family Hx, Social Hx, medications and allergies.   ROS:  Review of Systems  Constitutional:  Negative for fatigue and fever.  HENT:  Negative for congestion and sore throat.   Eyes:  Negative for visual disturbance.  Respiratory:  Negative for cough and shortness  of breath.   Cardiovascular:  Negative for chest pain.  Gastrointestinal:  Positive for abdominal pain (mild diffuse lower abdominal/pelvic). Negative for nausea and vomiting.  Genitourinary:  Negative for difficulty urinating, dysuria, flank pain and vaginal discharge.  Musculoskeletal:  Negative for back pain.  Neurological:  Negative for syncope and headaches.   Physical Exam  Patient Vitals for the past 24 hrs:  BP Temp Temp src Pulse Resp SpO2 Height Weight  04/12/21 1245 118/62 -- -- (!) 105 -- -- -- --  04/12/21 1046 136/83 99.3 F (37.4 C) Oral (!) 107 16 98 % 5\' 7"  (1.702 m) 145 lb 9.6 oz (66 kg)  04/12/21 1034 -- -- -- -- -- -- 5\' 7"  (1.702 m) --   Physical Exam Vitals and nursing note reviewed. Exam conducted with a chaperone present.  Constitutional:      Appearance: Normal appearance.  HENT:     Head: Normocephalic and atraumatic.  Eyes:     Pupils: Pupils are equal, round, and reactive to light.  Cardiovascular:     Rate and Rhythm: Normal rate and regular rhythm.  Pulmonary:     Effort: Pulmonary effort is normal.  Abdominal:     Palpations: Abdomen is soft.     Tenderness: There is no abdominal tenderness.  Genitourinary:    General: Normal vulva.  Musculoskeletal:        General: Normal range of motion.  Skin:    General: Skin is warm.     Capillary Refill: Capillary refill takes less than 2 seconds.  Neurological:     Mental Status: She is alert and oriented to person, place, and time.  Psychiatric:        Mood and Affect: Mood normal.        Behavior: Behavior normal.        Thought Content: Thought content normal.        Judgment: Judgment normal.   Labs: Results for orders placed or performed during the hospital encounter of 04/12/21 (from the past 24 hour(s))  Urinalysis, Routine w reflex microscopic Urine, Clean Catch     Status: Abnormal   Collection Time: 04/12/21 10:38 AM  Result Value Ref Range   Color, Urine STRAW (A) YELLOW   APPearance  CLEAR CLEAR   Specific Gravity, Urine 1.003 (L) 1.005 - 1.030   pH 7.0 5.0 - 8.0   Glucose, UA NEGATIVE NEGATIVE mg/dL   Hgb urine dipstick SMALL (A) NEGATIVE   Bilirubin Urine NEGATIVE NEGATIVE   Ketones, ur NEGATIVE NEGATIVE mg/dL   Protein, ur NEGATIVE NEGATIVE mg/dL   Nitrite NEGATIVE NEGATIVE   Leukocytes,Ua NEGATIVE NEGATIVE   WBC, UA 0-5 0 - 5 WBC/hpf   Bacteria, UA RARE (A) NONE SEEN   Squamous Epithelial / LPF 0-5 0 - 5  Pregnancy, urine POC     Status: Abnormal   Collection Time: 04/12/21 10:40 AM  Result Value Ref Range   Preg Test, Ur POSITIVE (A) NEGATIVE  CBC     Status: None   Collection Time: 04/12/21 11:27 AM  Result Value Ref Range   WBC 8.8 4.0 - 10.5 K/uL   RBC 4.64 3.87 - 5.11 MIL/uL   Hemoglobin 14.2 12.0 - 15.0 g/dL   HCT 40.8 36.0 - 46.0 %   MCV 87.9 80.0 - 100.0 fL   MCH 30.6 26.0 - 34.0 pg   MCHC 34.8 30.0 - 36.0 g/dL   RDW 11.9 11.5 - 15.5 %   Platelets 248 150 - 400 K/uL   nRBC 0.0 0.0 - 0.2 %  ABO/Rh     Status: None   Collection Time: 04/12/21 11:27 AM  Result Value Ref Range   ABO/RH(D) A POS    No rh immune globuloin      NOT A RH IMMUNE GLOBULIN CANDIDATE, PT RH POSITIVE Performed at [facility], [address], [place], [place] [address]   hCG, quantitative, pregnancy     Status: Abnormal   Collection Time: 04/12/21 11:27 AM  Result Value Ref Range   hCG, Beta Chain, Quant, S 843 (H) <5 mIU/mL  Wet prep, genital     Status: None   Collection Time: 04/12/21 11:30 AM  Result Value Ref Range   Yeast Wet Prep HPF POC NONE SEEN NONE SEEN   Trich, Wet Prep NONE SEEN NONE SEEN   Clue Cells Wet Prep HPF POC NONE SEEN NONE SEEN   WBC, Wet Prep HPF POC <10 <10   Sperm NONE SEEN    Imaging:  US OB LESS THAN 14 WEEKS WITH OB TRANSVAGINAL  Result Date: 04/12/2021 CLINICAL DATA:  Vaginal spotting for several days. Patient pregnant, 11 weeks and 6 days based on her last menstrual period. EXAM: OBSTETRIC <14 WK [place] AND TRANSVAGINAL OB US  TECHNIQUE: Both transabdominal and  transvaginal ultrasound examinations were performed for complete evaluation of the gestation as well as the maternal uterus, adnexal regions, and pelvic cul-de-sac. Transvaginal technique was performed to assess early pregnancy. COMPARISON:  None. FINDINGS: Intrauterine gestational sac: Single. Small sac lies in the left uterine fundus. 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 Yolk sac:  Not Visualized. Embryo:  Not Visualized. Cardiac Activity: Not Visualized. MSD: 3.2 mm   5 w   0 d Subchorionic hemorrhage:  None visualized. Maternal uterus/adnexae: Ovaries and adnexa are unremarkable. No uterine masses. IMPRESSION: 1. 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 Sac size consistent with a [redacted] week gestation. No embryo or yolk sac. Findings consistent with an early intrauterine pregnancy, but significantly earlier than expected based on the last menstrual period. Probable early intrauterine gestational sac, but no yolk sac, fetal pole, or cardiac activity yet visualized. Recommend follow-up quantitative B-HCG levels and follow-up US in 14 days to assess viability. This recommendation follows SRU consensus guidelines: Diagnostic Criteria for Nonviable Pregnancy Early in the First Trimester. [place] Med [id number]. Electronically Signed   By: Lajean Manes M.D.   On: 04/12/2021 11:54    MAU Course: Orders Placed This Encounter  Procedures   Wet prep, genital   US OB LESS THAN 14 WEEKS WITH OB TRANSVAGINAL   Urinalysis, Routine w reflex microscopic Urine, Clean Catch   CBC   hCG, quantitative, pregnancy   Pregnancy, urine POC   ABO/Rh   Discharge patient   No orders of the defined types were placed in this encounter.  MDM: Reassured patient that we saw a gestational sac but no yolk sac yet so cannot completely rule out ectopic pregnancy. IAB scheduled for 04/26/20, reassured that we have time to rule this out and recommended  repeat quant at [facility] on Wednesday, if it rises appropriately she can proceed, if not we may need to follow another quant or get a repeat scan in a week. Pt amenable to plan. Lab appt scheduled.   Assessment: 1. Pregnancy of unknown anatomic location   2. Vaginal bleeding affecting early pregnancy   3. [redacted] weeks gestation of pregnancy    Plan: Discharge home in stable condition with ectopic precautions.     Follow-up [facility] Follow up in 2 day(s).   Specialty: Obstetrics and Gynecology Why: For repeat bloodwork, then set up regular GYN care Contact information: [address] [place] [phone]                Allergies as of 04/12/2021   No Known Allergies      Medication List     TAKE these medications    MULTIVITAMIN PO Take by mouth.   ZINC PO Take by mouth.       [name], CNM, MSN, [place] Certified Nurse Midwife, [facility]

## 2021-04-12 NOTE — MAU Note (Signed)
Courtney Boyle is a 31 y.o. at [redacted]w[redacted]d here in MAU reporting: [facility] but states they didn't see anything. For the past 4 days has been seeing light spotting when she wipes. Having some abdominal discomfort. Reports + UPT 5-6 days ago.  LMP: 01/19/21, states irregular periods  Onset of complaint: ongoing  Pain score: 0/10  Vitals:   04/12/21 1046  BP: 136/83  Pulse: (!) 107  Resp: 16  Temp: 99.3 F (37.4 C)  SpO2: 98%     Lab orders placed from triage: ua, upt

## 2021-04-13 ENCOUNTER — Encounter (HOSPITAL_COMMUNITY): Payer: Self-pay | Admitting: Obstetrics and Gynecology

## 2021-04-13 ENCOUNTER — Other Ambulatory Visit: Payer: Self-pay

## 2021-04-13 ENCOUNTER — Inpatient Hospital Stay (HOSPITAL_COMMUNITY)
Admission: AD | Admit: 2021-04-13 | Discharge: 2021-04-13 | Disposition: A | Discharge status: Home / Self Care | Payer: BC Managed Care – PPO | Attending: Obstetrics and Gynecology | Admitting: Obstetrics and Gynecology

## 2021-04-13 DIAGNOSIS — O3680X0 Pregnancy with inconclusive fetal viability, not applicable or unspecified: Secondary | ICD-10-CM | POA: Diagnosis not present

## 2021-04-13 DIAGNOSIS — O26891 Other specified pregnancy related conditions, first trimester: Secondary | ICD-10-CM | POA: Insufficient documentation

## 2021-04-13 DIAGNOSIS — O209 Hemorrhage in early pregnancy, unspecified: Secondary | ICD-10-CM | POA: Insufficient documentation

## 2021-04-13 DIAGNOSIS — M545 Low back pain, unspecified: Secondary | ICD-10-CM | POA: Insufficient documentation

## 2021-04-13 DIAGNOSIS — Z3A01 Less than 8 weeks gestation of pregnancy: Secondary | ICD-10-CM

## 2021-04-13 LAB — HCG, QUANTITATIVE, PREGNANCY: hCG, Beta Chain, Quant, S: 1610 m[IU]/mL — ABNORMAL HIGH (ref <5)

## 2021-04-13 LAB — GC/CHLAMYDIA PROBE AMP (~~LOC~~) NOT AT ARMC
Chlamydia: NEGATIVE
Comment: NEGATIVE
Comment: NORMAL
Neisseria Gonorrhea: NEGATIVE

## 2021-04-13 NOTE — MAU Provider Note (Signed)
Chief Complaint: Vaginal Bleeding   Event Date/Time   First Provider Initiated Contact with Patient 04/13/21 2014        SUBJECTIVE HPI: Courtney Boyle is a 31 y.o. G2P0010 at [redacted]w[redacted]d by LMP who presents to maternity admissions reporting vaginal bleeding which is somewhat heavier than it was yesterday.  States it is like a period.  Some mild low back pain.  Is scheduled to have a second HCG tomorrow in clinic.  [place] yesterday showed small GS only in uterus. . She denies vaginal itching/burning, urinary symptoms, h/a, dizziness, n/v, or fever/chills.    Has an appointment at [facility] for 04/26/20 for medical abortion.   Vaginal Bleeding The patient's primary symptoms include vaginal bleeding. The patient's pertinent negatives include no genital itching, genital lesions or genital odor. This is a recurrent problem. The problem has been gradually worsening. The pain is mild. The problem affects both sides. She is pregnant. Associated symptoms include back pain. Pertinent negatives include no abdominal pain, chills or fever. The vaginal discharge was bloody. The vaginal bleeding is typical of menses. She has been passing clots. She has not been passing tissue. Nothing aggravates the symptoms. She has tried nothing for the symptoms.   RN Note: Was seen in MAU yesterday with spotting. Had u/s and saw IUP but was around 5wk and not 12wks. Plans to have termination. Today bleeding is more like a period and having some lower back cramping. Here to r/o ectopic. "If I am miscarrying would not be the worst thing"  History reviewed. No pertinent past medical history. Past Surgical History:  Procedure Laterality Date   WISDOM TOOTH EXTRACTION Bilateral 04/12/11   Social History   Socioeconomic History   Marital status: Single    Spouse name: Not on file   Number of children: Not on file   Years of education: Not on file   Highest education level: Not on file  Occupational History   Not on file   Tobacco Use   Smoking status: Never   Smokeless tobacco: Never  Substance and Sexual Activity   Alcohol use: Yes    Alcohol/week: 6.0 standard drinks    Types: 6 Standard drinks or equivalent per week   Drug use: Never   Sexual activity: Yes    Partners: Male    Birth control/protection: Condom  Other Topics Concern   Not on file  Social History Narrative   Not on file   Social Determinants of Health   Financial Resource Strain: Not on file  Food Insecurity: Not on file  Transportation Needs: Not on file  Physical Activity: Not on file  Stress: Not on file  Social Connections: Not on file  Intimate Partner Violence: Not on file   No current facility-administered medications on file prior to encounter.   Current Outpatient Medications on File Prior to Encounter  Medication Sig Dispense Refill   Multiple Vitamin (MULTIVITAMIN PO) Take by mouth.     Multiple Vitamins-Minerals (ZINC PO) Take by mouth.     No Known Allergies  I have reviewed patient's Past Medical Hx, Surgical Hx, Family Hx, Social Hx, medications and allergies.   ROS:  Review of Systems  Constitutional:  Negative for chills and fever.  Gastrointestinal:  Negative for abdominal pain.  Genitourinary:  Positive for vaginal bleeding.  Musculoskeletal:  Positive for back pain.  Review of Systems  Other systems negative   Physical Exam  Physical Exam Patient Vitals for the past 24 hrs:  BP Temp Pulse  Resp SpO2 Height Weight  04/13/21 1958 125/66 -- -- -- -- -- --  04/13/21 1956 -- 98.2 F (36.8 C) 91 16 98 % 5\' 7"  (1.702 m) 66.7 kg   Constitutional: Well-developed, well-nourished female in no acute distress.  Cardiovascular: normal rate Respiratory: normal effort GI: Abd soft, non-tender.  MS: Extremities nontender, no edema, normal ROM Neurologic: Alert and oriented x 4.  GU: Neg CVAT.  PELVIC EXAM: deferred, just done yesterday, no new adnexal pain  LAB RESULTS   --/--/A POS (01/02  1127)  IMAGING (yesterday) 06-27-1987 OB LESS THAN 14 WEEKS WITH OB TRANSVAGINAL  Result Date: 04/12/2021 CLINICAL DATA:  Vaginal spotting for several days. Patient pregnant, 11 weeks and 6 days based on her last menstrual period. EXAM: OBSTETRIC <14 WK 06/10/2021 AND TRANSVAGINAL OB US TECHNIQUE: Both transabdominal and transvaginal ultrasound examinations were performed for complete evaluation of the gestation as well as the maternal uterus, adnexal regions, and pelvic cul-de-sac. Transvaginal technique was performed to assess early pregnancy. COMPARISON:  None. FINDINGS: Intrauterine gestational sac: Single. Small sac lies in the left uterine fundus. 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 Yolk sac:  Not Visualized. Embryo:  Not Visualized. Cardiac Activity: Not Visualized. MSD: 3.2 mm   5 w   0 d Subchorionic hemorrhage:  None visualized. Maternal uterus/adnexae: Ovaries and adnexa are unremarkable. No uterine masses. IMPRESSION: 1. 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 Sac size consistent with a [redacted] week gestation. No embryo or yolk sac. Findings consistent with an early intrauterine pregnancy, but significantly earlier than expected based on the last menstrual period. Probable early intrauterine gestational sac, but no yolk sac, fetal pole, or cardiac activity yet visualized. Recommend follow-up quantitative B-HCG levels and follow-up US in 14 days to assess viability. This recommendation follows SRU consensus guidelines: Diagnostic Criteria for Nonviable Pregnancy Early in the First Trimester. [place] Med [id number]. Electronically Signed   By: ; [id number] M.D.   On: 04/12/2021 11:54    MAU Management/MDM: Ordered repeat HCG level     Pelvic exam and cultures done yesterday  This bleeding/pain can represent a normal pregnancy with bleeding, spontaneous abortion or even an ectopic which can be life-threatening.  The process as listed above helps to determine which of  these is present.  Discussed there will not likely be much change in 06/10/2021 since yesterday, except possibly loss of Presumed Gestational Sac.   Recommend repeating HCG.  Whether it goes up or down, would still repeat in 48 hrs (move appt from tomorrow to Thursday) Discussed cannot rule out ectopic just yet. So would repeat Tuesday next week if levels stay same or rise at all.     ASSESSMENT Pregnancy at [redacted]w[redacted]d Pregnancy of unknown location Bleeding in early pregnancy  PLAN Discharge home I will call pt when HCG results return Plan to repeat HCG level in 48 hours in clinic per Thursday Will repeat  Ultrasound in about 7-10 days if HCG levels double appropriately or if they rise at all.  Ectopic precautions  Pt stable at time of discharge. Encouraged to return here if she develops worsening of symptoms, increase in pain, fever, or other concerning symptoms.    Wednesday CNM, MSN Certified Nurse-Midwife 04/13/2021  8:14 PM

## 2021-04-13 NOTE — Progress Notes (Signed)
Hansel Feinstein CNM in earlier to discuss d/c plan with pt. CNM will call pt with BHCG results and f/u plan. Written and verbal d/c instructions given and understanding voiced.

## 2021-04-13 NOTE — MAU Provider Note (Signed)
HCG results reviewed  Results for orders placed or performed during the hospital encounter of 04/13/21 (from the past 24 hour(s))  hCG, quantitative, pregnancy     Status: Abnormal   Collection Time: 04/13/21  8:35 PM  Result Value Ref Range   hCG, Beta Chain, Quant, S 1,610 (H) <5 mIU/mL   HCG Yesterday: 843 High    Patient called and notified of results  Discussed we need to repeat this HCG on Thursday and then may need an [place] on either Friday or early next week to rule in or out ectopic pregnancy (small possible gestational sac seen last night when hCG was 843.)  Patient will call office in am

## 2021-04-13 NOTE — MAU Note (Signed)
Was seen in MAU yesterday with spotting. Had u/s and saw IUP but was around 5wk and not 12wks. Plans to have termination. Today bleeding is more like a period and having some lower back cramping. Here to r/o ectopic. "If I am miscarrying would not be the worst thing"

## 2021-04-14 ENCOUNTER — Ambulatory Visit: Payer: BC Managed Care – PPO

## 2021-04-15 ENCOUNTER — Ambulatory Visit (INDEPENDENT_AMBULATORY_CARE_PROVIDER_SITE_OTHER): Payer: BC Managed Care – PPO

## 2021-04-15 ENCOUNTER — Other Ambulatory Visit: Payer: Self-pay

## 2021-04-15 ENCOUNTER — Telehealth: Payer: Self-pay

## 2021-04-15 VITALS — BP 118/73 | HR 102 | Wt 145.8 lb

## 2021-04-15 DIAGNOSIS — O3680X0 Pregnancy with inconclusive fetal viability, not applicable or unspecified: Secondary | ICD-10-CM

## 2021-04-15 LAB — BETA HCG QUANT (REF LAB): hCG Quant: 2430 m[IU]/mL

## 2021-04-15 NOTE — Telephone Encounter (Addendum)
Called pt prior to appt for stat beta HCG to review symptoms. Patient reports heavy bleeding with clots yesterday. States she is experiencing mild discomfort that is lighter today. No bleeding today. Pt would like to come for beta HCG level to confirm viability.

## 2021-04-15 NOTE — Progress Notes (Signed)
Beta HCG Follow-up Visit  Courtney Boyle presents to [facility] for follow-up beta HCG lab. She was seen in MAU on 04/12/21 and 04/13/20 for abdominal pain and vaginal bleeding during early pregnancy without confirmed IUP. Reports bleeding like a period with dark clots passed yesterday. Patient denies abdominal pain or vaginal bleeding today. Pt states this is not a desired pregnancy and she has scheduled TAB appt for 04/26/21. Discussed with patient that we are following beta HCG levels today.  Valid contact number for patient confirmed. I will call the patient with results before end of day.  Beta HCG results: 04/12/21 @ [id number]  04/13/21 @ 2035 1610  04/15/21 @ 1000 2430   Results and patient history reviewed with [name], CNM, who states this appears to be a viable pregnancy but there is concern for miscarriage due to amount of vaginal bleeding. Provider recommends patient return in 1 week for non stat beta hCG to confirm pregnancy has continued to progress. Patient called and informed of plan for follow-up. Reviewed return precautions with patient. Appt scheduled for 04/21/21; if HCG rises appropriately, pt will keep appt for TAB on 04/26/21.  [name] 04/15/2021 9:48 AM

## 2021-04-20 ENCOUNTER — Other Ambulatory Visit: Payer: Self-pay

## 2021-04-20 DIAGNOSIS — O209 Hemorrhage in early pregnancy, unspecified: Secondary | ICD-10-CM

## 2021-04-20 DIAGNOSIS — O3680X0 Pregnancy with inconclusive fetal viability, not applicable or unspecified: Secondary | ICD-10-CM

## 2021-04-20 NOTE — Progress Notes (Signed)
best!

## 2021-04-22 ENCOUNTER — Other Ambulatory Visit: Payer: Self-pay

## 2021-04-22 ENCOUNTER — Other Ambulatory Visit: Payer: BC Managed Care – PPO

## 2021-04-22 DIAGNOSIS — O209 Hemorrhage in early pregnancy, unspecified: Secondary | ICD-10-CM

## 2021-04-22 DIAGNOSIS — O3680X0 Pregnancy with inconclusive fetal viability, not applicable or unspecified: Secondary | ICD-10-CM | POA: Diagnosis not present

## 2021-04-23 LAB — BETA HCG QUANT (REF LAB): hCG Quant: 15658 m[IU]/mL

## 2021-05-26 ENCOUNTER — Encounter: Payer: Self-pay | Admitting: Obstetrics and Gynecology

## 2021-05-26 ENCOUNTER — Ambulatory Visit (INDEPENDENT_AMBULATORY_CARE_PROVIDER_SITE_OTHER): Payer: BC Managed Care – PPO | Admitting: Obstetrics and Gynecology

## 2021-05-26 ENCOUNTER — Other Ambulatory Visit (HOSPITAL_COMMUNITY)
Admission: RE | Admit: 2021-05-26 | Discharge: 2021-05-26 | Disposition: A | Discharge status: Home / Self Care | Payer: BC Managed Care – PPO | Source: Ambulatory Visit | Attending: Obstetrics and Gynecology | Admitting: Obstetrics and Gynecology

## 2021-05-26 ENCOUNTER — Other Ambulatory Visit: Payer: Self-pay

## 2021-05-26 VITALS — HR 74

## 2021-05-26 DIAGNOSIS — Z124 Encounter for screening for malignant neoplasm of cervix: Secondary | ICD-10-CM | POA: Diagnosis not present

## 2021-05-26 DIAGNOSIS — N939 Abnormal uterine and vaginal bleeding, unspecified: Secondary | ICD-10-CM | POA: Diagnosis not present

## 2021-05-26 DIAGNOSIS — Z01419 Encounter for gynecological examination (general) (routine) without abnormal findings: Secondary | ICD-10-CM

## 2021-05-26 MED ORDER — NORETHINDRONE 0.35 MG PO TABS: 1.0000 | ORAL_TABLET | Freq: Every day | ORAL | 3 refills | Status: AC

## 2021-05-26 NOTE — Patient Instructions (Signed)

## 2021-05-26 NOTE — Progress Notes (Signed)
31 y.o. G62P0010 Single Caucasian female here for annual exam.   Patient had VIP on 04-26-21. Began 05-21-21 with spotting and thinks this is possibly a menses. Would like to discuss BC options.  Had a slightly positive pregnancy test 3 weeks post procedure.  She then had a negative test after this.   Took birth control pills in the past and had low energy and felt cloudy.  This was last fall.  She had a medical VIP prior to this.   Works for [employer].  Engaged.  Would consider future child bearing in about 6 months.   PCP:   Joycelyn Rua, MD  Patient's last menstrual period was 01/19/2021.           Sexually active: Yes.    The current method of family planning is condoms every time.    Exercising: No.   running Smoker:  no  Health Maintenance: Pap:  01-12-18 Neg, 12-25-14 neg History of abnormal Pap:  no MMG:  n/a Colonoscopy:  n/a BMD:   n/a  Result  n/a TDaP:  2009--Unsure if up to date Gardasil:   1 of 3-does not want to complete HIV: 01-12-18 NR Hep C: 01-12-18 Neg Screening Labs:  declined.    reports that she has never smoked. She has never used smokeless tobacco. She reports current alcohol use of about 2.0 standard drinks per week. She reports that she does not use drugs.  No past medical history on file.  Past Surgical History:  Procedure Laterality Date   WISDOM TOOTH EXTRACTION Bilateral 04/12/11    No current outpatient medications on file.   No current facility-administered medications for this visit.    Family History  Problem Relation Age of Onset   Breast cancer Paternal Aunt    Colon cancer Maternal Grandfather    Heart disease Maternal Grandfather     Review of Systems  All other systems reviewed and are negative.  Exam:   BP (P) 122/70    Pulse 74    Ht (P) 5' 6.5" (1.689 m)    Wt (P) 146 lb (66.2 kg)    LMP 01/19/2021    SpO2 99%    Breastfeeding Unknown    BMI (P) 23.21 kg/m     General appearance: alert, cooperative and appears  stated age Head: normocephalic, without obvious abnormality, atraumatic Neck: no adenopathy, supple, symmetrical, trachea midline and thyroid normal to inspection and palpation Lungs: clear to auscultation bilaterally Breasts: normal appearance, no masses or tenderness, No nipple retraction or dimpling, No nipple discharge or bleeding, No axillary adenopathy Heart: regular rate and rhythm Abdomen: soft, non-tender; no masses, no organomegaly Extremities: extremities normal, atraumatic, no cyanosis or edema Skin: skin color, texture, turgor normal. No rashes or lesions Lymph nodes: cervical, supraclavicular, and axillary nodes normal. Neurologic: grossly normal  Pelvic: External genitalia:  no lesions              No abnormal inguinal nodes palpated.              Urethra:  normal appearing urethra with no masses, tenderness or lesions              Bartholins and Skenes: normal                 Vagina: normal appearing vagina with normal color and discharge, no lesions              Cervix: no lesions.  Blood noted in vagina coming  from os.               Pap taken: yes Bimanual Exam:  Uterus:  normal size, contour, position, consistency, mobility, non-tender              Adnexa: no mass, fullness, tenderness              Chaperone was present for exam:  Marchelle Folks, CMA  Assessment:   Well woman visit with gynecologic exam. Recent VIP.  Vaginal spotting.   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   Plan: Mammogram screening age 69. Self breast awareness reviewed. Pap and HR HPV as above. Guidelines for Calcium, Vitamin D, regular exercise program including cardiovascular and weight bearing exercise. Declined Rubella titer and TDap.  Will check quant hCG.  If hCG negative, she will start Micronor.  Rx given for one year.  Instructed in use.  We discussed avoidance of exposures including tobacco, ETOH, and cat litter box when trying for pregnancy.  Start PNV 3 months prior to stopping birth control.   I mentioned What to Expect prior to Expecting as a good source of information on pregnancy planning.  Follow up annually and prn.   After visit summary provided.

## 2021-05-27 ENCOUNTER — Encounter: Payer: Self-pay | Admitting: Obstetrics and Gynecology

## 2021-05-27 LAB — HCG, QUANTITATIVE, PREGNANCY: HCG, Total, QN: 4 m[IU]/mL

## 2021-05-28 LAB — CYTOLOGY - PAP
Comment: NEGATIVE
Diagnosis: NEGATIVE
Diagnosis: REACTIVE
High risk HPV: NEGATIVE

## 2021-05-29 ENCOUNTER — Encounter: Payer: Self-pay | Admitting: Obstetrics and Gynecology

## 2021-06-09 ENCOUNTER — Encounter: Payer: Self-pay | Admitting: Obstetrics and Gynecology

## 2021-06-09 NOTE — Telephone Encounter (Signed)
This message was placed on previous message sent from patient.  ?

## 2021-06-09 NOTE — Telephone Encounter (Signed)
Patient another message saying "I did pass a small clot with a bit more reddish blood, so maybe my body is trying to start another period? ?. Haha" ?

## 2021-06-21 ENCOUNTER — Encounter: Payer: Self-pay | Admitting: Obstetrics and Gynecology

## 2021-06-21 DIAGNOSIS — N926 Irregular menstruation, unspecified: Secondary | ICD-10-CM

## 2021-06-21 NOTE — Telephone Encounter (Signed)
Please contact patient back regarding her My Chart message.  ? ?Please schedule an appointment for patient to have a pelvic ultrasound and follow up appointment with me.  ? ?She is having irregular vaginal bleeding.  ?

## 2021-06-21 NOTE — Telephone Encounter (Signed)
Courtney Boyle  P [facility] (supporting Patton Salles, MD) 3 minutes ago (2:19 PM)  ? ?I?m just wondering what the risks might be of declining and if I can just give it a bit more time to see if things level out? I do already have the appointment scheduled to be safe but financially I?m just a bit concerned.  ?  ? ?

## 2021-06-21 NOTE — Telephone Encounter (Signed)
Appointments please see Dr.Silva message below, order placed. ?

## 2021-06-24 ENCOUNTER — Other Ambulatory Visit: Payer: BC Managed Care – PPO

## 2021-06-24 ENCOUNTER — Other Ambulatory Visit: Payer: BC Managed Care – PPO | Admitting: Obstetrics and Gynecology

## 2021-06-30 ENCOUNTER — Encounter: Payer: Self-pay | Admitting: Obstetrics and Gynecology

## 2021-07-01 ENCOUNTER — Encounter: Payer: Self-pay | Admitting: Obstetrics and Gynecology

## 2021-07-01 NOTE — Telephone Encounter (Signed)
Appointments per Dr.Silva okay to schedule virtual visit to discuss bleeding and BCP options.  ?

## 2021-07-01 NOTE — Telephone Encounter (Signed)
Ok for virtual visit

## 2021-07-14 ENCOUNTER — Encounter: Payer: Self-pay | Admitting: Obstetrics and Gynecology

## 2021-07-27 ENCOUNTER — Ambulatory Visit: Payer: No Typology Code available for payment source | Admitting: Obstetrics and Gynecology

## 2021-08-03 ENCOUNTER — Encounter: Payer: Self-pay | Admitting: Obstetrics and Gynecology

## 2021-08-03 NOTE — Telephone Encounter (Signed)
I do recommend an office visit.  ? ?The patient has had ongoing bleeding for about 2 months.  ? ? ?

## 2021-08-04 NOTE — Telephone Encounter (Signed)
Ok if she wants to stop taking the birth control pills.  ? ?If any abnormal bleeding persists, she needs an office visit.  ?

## 2021-09-05 ENCOUNTER — Encounter: Payer: Self-pay | Admitting: Obstetrics and Gynecology

## 2021-09-19 ENCOUNTER — Encounter: Payer: Self-pay | Admitting: Obstetrics and Gynecology

## 2021-09-28 NOTE — Progress Notes (Deleted)
GYNECOLOGY  VISIT   HPI: 31 y.o.   Single  Caucasian  female   G3P0020 with No LMP recorded.   here for positive home UPT.    GYNECOLOGIC HISTORY: No LMP recorded. Contraception:  ***condoms or Micronor?? Menopausal hormone therapy:  n/a Last mammogram:  n/a Last pap smear:  05-26-21 Neg:Neg HR HPV, 01-12-18 Neg, 12-25-14 neg         OB History     Gravida  3   Para  0   Term  0   Preterm  0   AB  2   Living  0      SAB  0   IAB  2   Ectopic  0   Multiple  0   Live Births                 There are no problems to display for this patient.   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 84 tablet 3   No current facility-administered medications for this visit.     ALLERGIES: Patient has no known allergies.  Family History  Problem Relation Age of Onset   Breast cancer Paternal Aunt    Colon cancer Maternal Grandfather    Heart disease Maternal Grandfather     Social History   Socioeconomic History   Marital status: Single    Spouse name: Not on file   Number of children: Not on file   Years of education: Not on file   Highest education level: Not on file  Occupational History   Not on file  Tobacco Use   Smoking status: Never   Smokeless tobacco: Never  Vaping Use   Vaping Use: Never used  Substance and Sexual Activity   Alcohol use: Yes    Alcohol/week: 2.0 standard drinks of alcohol    Types: 2 Standard drinks or equivalent per week   Drug use: Never   Sexual activity: Yes    Partners: Male    Birth control/protection: Condom  Other Topics Concern   Not on file  Social History Narrative   Not on file    Social Determinants of Health   Financial Resource Strain: Not on file  Food Insecurity: Not on file  Transportation Needs: Not on file  Physical Activity: Not on file  Stress: Not on file  Social Connections: Not on file  Intimate Partner Violence: Not on file    Review of Systems  PHYSICAL EXAMINATION:    There were no vitals taken for this visit.    General appearance: alert, cooperative and appears stated age Head: Normocephalic, without obvious abnormality, atraumatic Neck: no adenopathy, supple, symmetrical, trachea midline and thyroid normal to inspection and palpation Lungs: clear to auscultation bilaterally Breasts: normal appearance, no masses or tenderness, No nipple retraction or dimpling, No nipple discharge or bleeding, No axillary or supraclavicular adenopathy Heart: regular rate and rhythm Abdomen: soft, non-tender, no masses,  no organomegaly Extremities: extremities normal, atraumatic, no cyanosis or edema Skin: Skin color, texture, turgor normal. No rashes or lesions Lymph nodes: Cervical, supraclavicular, and axillary nodes normal. No abnormal inguinal nodes palpated Neurologic: Grossly normal  Pelvic: External  genitalia:  no lesions              Urethra:  normal appearing urethra with no masses, tenderness or lesions              Bartholins and Skenes: normal                 Vagina: normal appearing vagina with normal color and discharge, no lesions              Cervix: no lesions                Bimanual Exam:  Uterus:  normal size, contour, position, consistency, mobility, non-tender              Adnexa: no mass, fullness, tenderness              Rectal exam: {yes no:[id number]}.  Confirms.              Anus:  normal sphincter tone, no lesions  Chaperone was present for exam:  ***  ASSESSMENT     PLAN     An After Visit Summary was printed and given to the patient.  ______ minutes face to face time of which over 50% was spent in counseling.

## 2021-09-30 ENCOUNTER — Ambulatory Visit: Payer: BC Managed Care – PPO | Admitting: Obstetrics and Gynecology

## 2021-09-30 DIAGNOSIS — Z0289 Encounter for other administrative examinations: Secondary | ICD-10-CM

## 2022-04-09 DIAGNOSIS — J101 Influenza due to other identified influenza virus with other respiratory manifestations: Secondary | ICD-10-CM | POA: Diagnosis not present

## 2022-04-27 DIAGNOSIS — J018 Other acute sinusitis: Secondary | ICD-10-CM | POA: Diagnosis not present

## 2023-05-24 IMAGING — US US OB < 14 WEEKS - US OB TV
1 series · 15 of 28 positions shown · non-contrast
Comparison: None.

CLINICAL DATA: Vaginal spotting for several days. Patient pregnant,
11 weeks and 6 days based on her last menstrual period.

EXAM:
OBSTETRIC <14 WK US AND TRANSVAGINAL OB US
TECHNIQUE: Both transabdominal and transvaginal ultrasound examinations were
performed for complete evaluation of the gestation as well as the
maternal uterus, adnexal regions, and pelvic cul-de-sac.
Transvaginal technique was performed to assess early pregnancy.

[Series 1: us ob < 14 weeks - us ob tv · 15 of 52 slices shown]
[im 1/52]
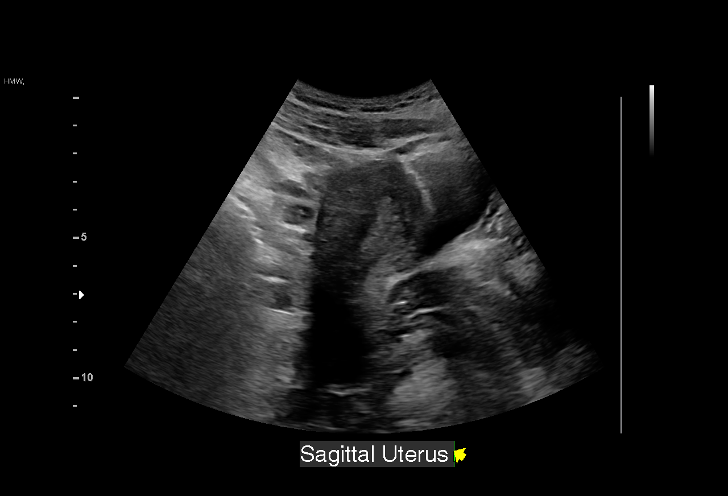
[im 4/52]
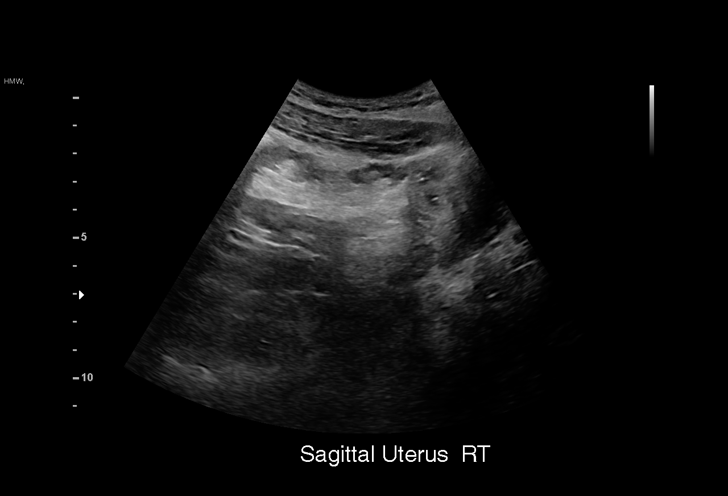
[im 8/52]
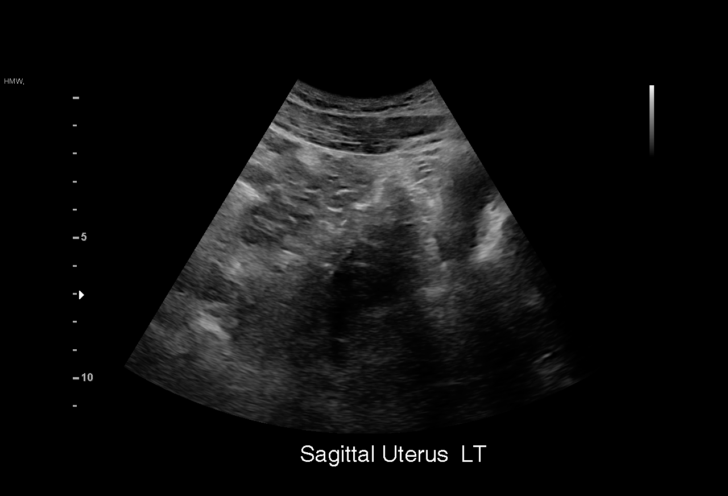
[im 12/52]
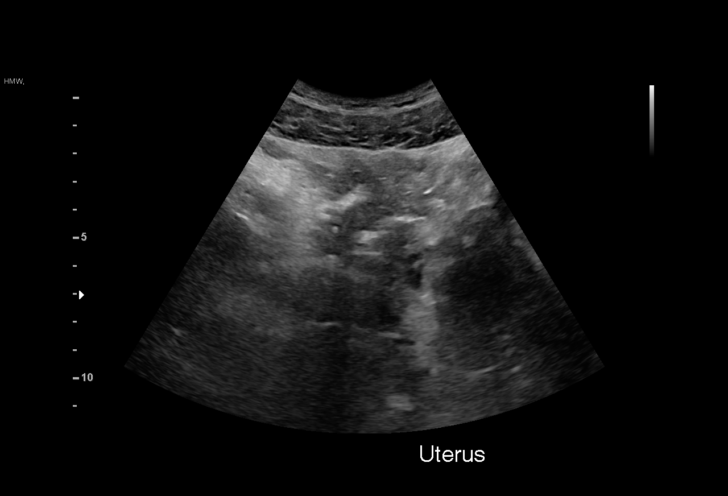
[im 16/52]
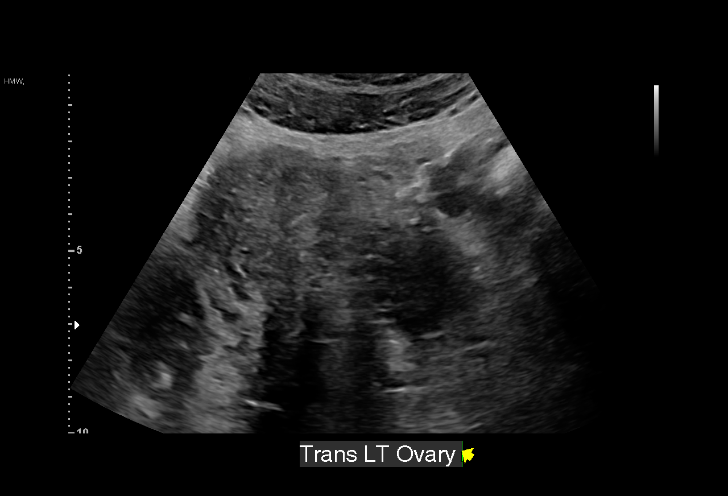
[im 19/52]
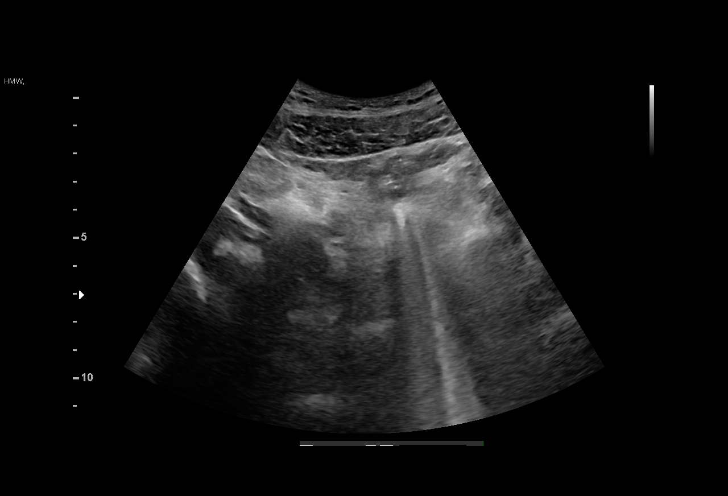
[im 23/52]
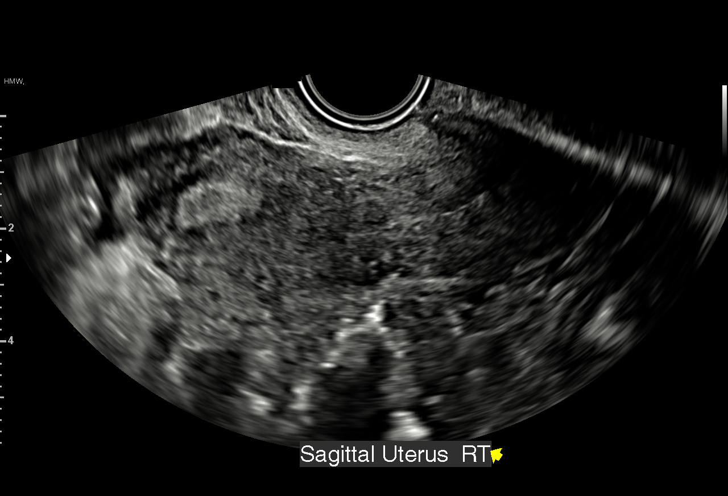
[im 27/52]
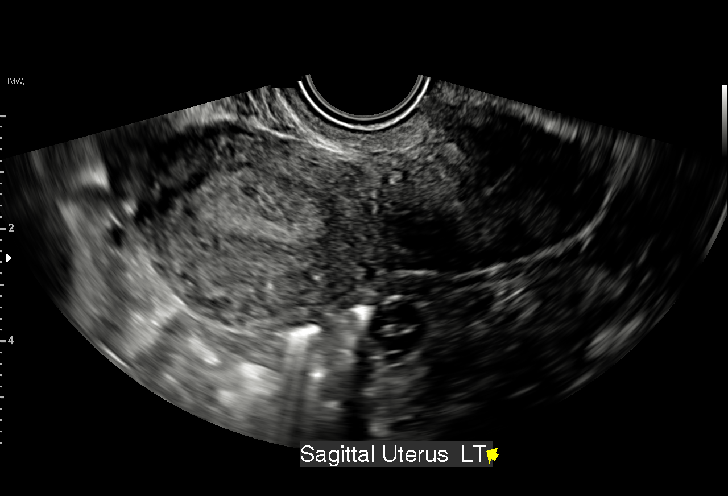
[im 29/52]
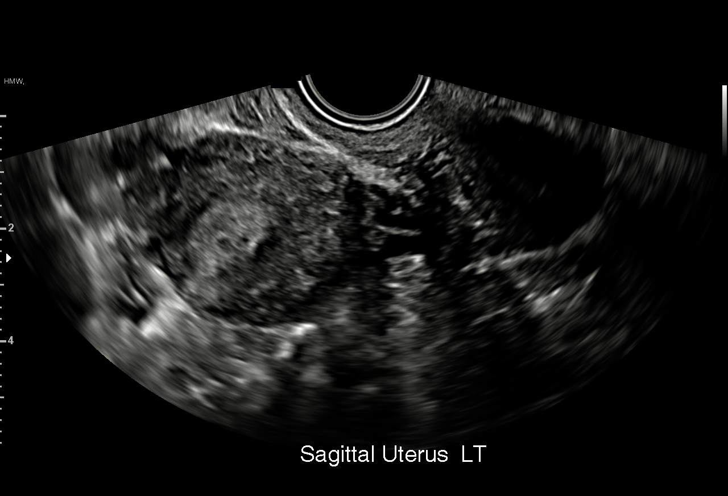
[im 33/52]
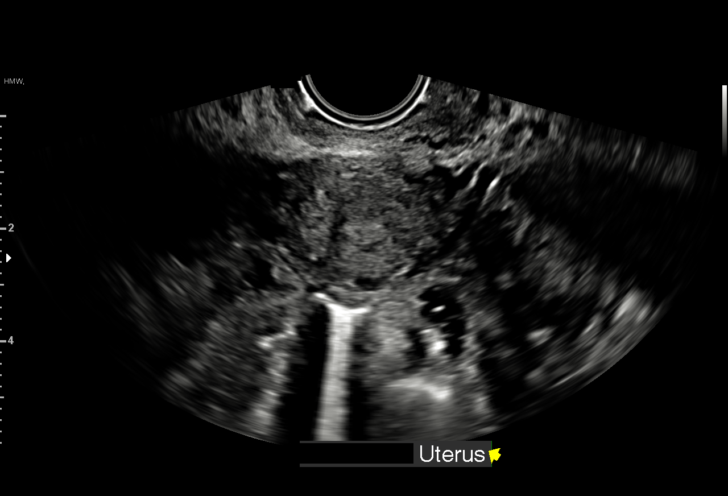
[im 36/52]
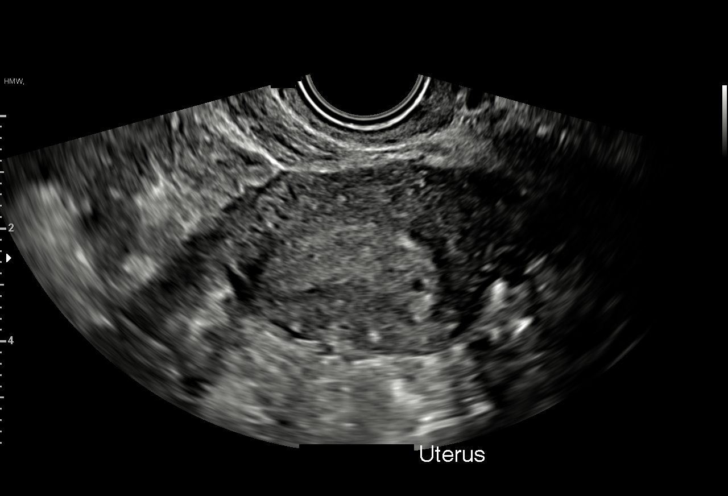
[im 40/52]
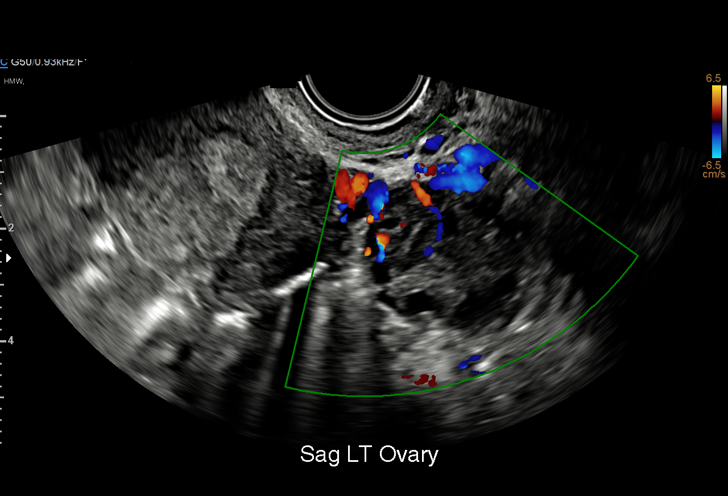
[im 44/52]
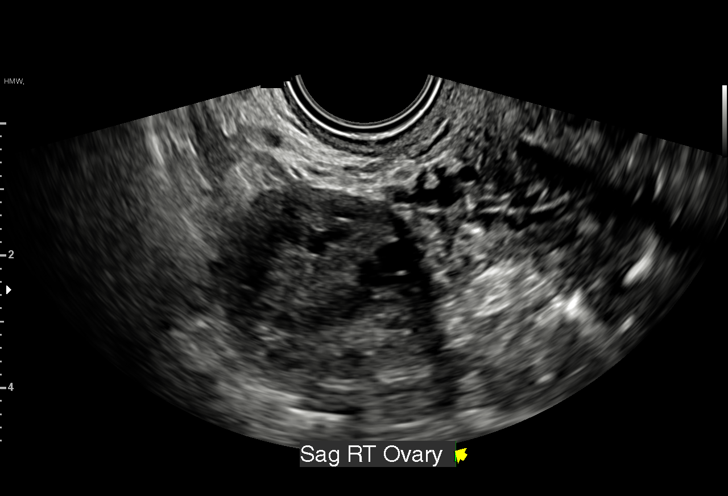
[im 48/52]
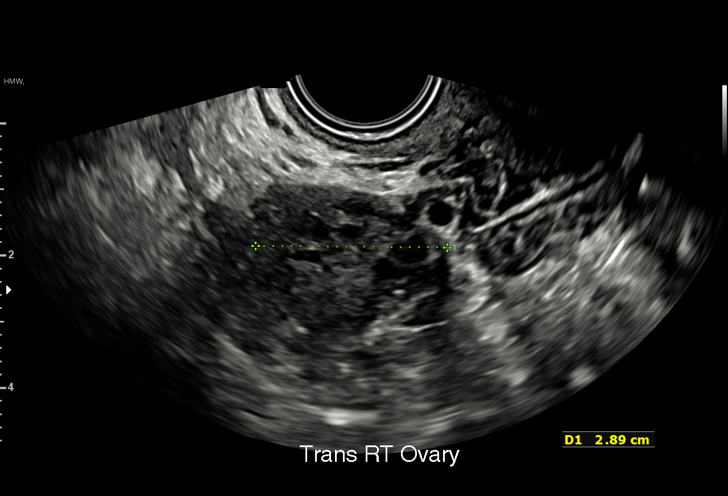
[im 52/52]
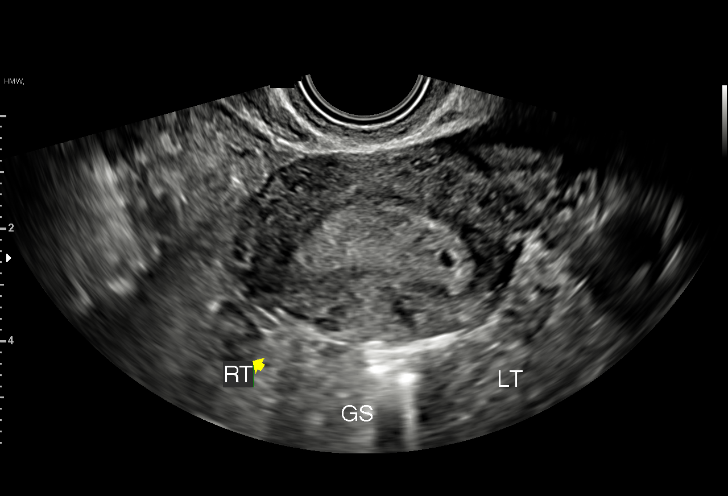

[15 of 28 positions shown; findings below may reference images not displayed]

FINDINGS: Intrauterine gestational sac: Single. Small sac lies in the left
uterine fundus. There is possibly bicornuate.

Yolk sac:  Not Visualized.

Embryo:  Not Visualized.

Cardiac Activity: Not Visualized.

MSD: 3.2 mm   5 w   0 d

Subchorionic hemorrhage:  None visualized.

Maternal uterus/adnexae: Ovaries and adnexa are unremarkable. No
uterine masses.
IMPRESSION: 1. Small apparent gestational sac in the left uterine fundus,
possibly a bicornuate uterus. Sac size consistent with a 5 week
gestation. No embryo or yolk sac. Findings consistent with an early
intrauterine pregnancy, but significantly earlier than expected
based on the last menstrual period. Probable early intrauterine
gestational sac, but no yolk sac, fetal pole, or cardiac activity
yet visualized. Recommend follow-up quantitative B-HCG levels and
follow-up US in 14 days to assess viability. This recommendation
follows SRU consensus guidelines: Diagnostic Criteria for Nonviable
Pregnancy Early in the First Trimester. N Engl J Med 1252;
